# Patient Record
Sex: Female | Born: 1975
Health system: Southern US, Community
[De-identification: ages and names within clinical notes are randomized; demographics above are authoritative.]

## PROBLEM LIST (undated history)

## (undated) DIAGNOSIS — K219 Gastro-esophageal reflux disease without esophagitis: Secondary | ICD-10-CM

## (undated) DIAGNOSIS — K5792 Diverticulitis of intestine, part unspecified, without perforation or abscess without bleeding: Secondary | ICD-10-CM

## (undated) DIAGNOSIS — E669 Obesity, unspecified: Secondary | ICD-10-CM

## (undated) DIAGNOSIS — R011 Cardiac murmur, unspecified: Secondary | ICD-10-CM

## (undated) HISTORY — PX: TONSILLECTOMY: SUR1361

## (undated) HISTORY — DX: Obesity, unspecified: E66.9

## (undated) HISTORY — DX: Diverticulitis of intestine, part unspecified, without perforation or abscess without bleeding: K57.92

## (undated) HISTORY — DX: Cardiac murmur, unspecified: R01.1

## (undated) HISTORY — DX: Gastro-esophageal reflux disease without esophagitis: K21.9

---

## 2008-12-09 ENCOUNTER — Emergency Department (HOSPITAL_COMMUNITY): Admission: EM | Admit: 2008-12-09 | Discharge: 2008-12-09 | Payer: Self-pay | Admitting: Emergency Medicine

## 2017-05-09 ENCOUNTER — Ambulatory Visit: Payer: Self-pay | Admitting: Family Medicine

## 2017-05-10 ENCOUNTER — Ambulatory Visit: Payer: Self-pay | Admitting: Family Medicine

## 2017-11-18 ENCOUNTER — Encounter (HOSPITAL_COMMUNITY): Payer: Self-pay

## 2017-11-18 ENCOUNTER — Emergency Department (HOSPITAL_COMMUNITY)
Admission: EM | Admit: 2017-11-18 | Discharge: 2017-11-18 | Disposition: A | Discharge status: Home / Self Care | Payer: No Typology Code available for payment source | Attending: Emergency Medicine | Admitting: Emergency Medicine

## 2017-11-18 DIAGNOSIS — S0990XA Unspecified injury of head, initial encounter: Secondary | ICD-10-CM | POA: Diagnosis present

## 2017-11-18 DIAGNOSIS — Y9389 Activity, other specified: Secondary | ICD-10-CM | POA: Insufficient documentation

## 2017-11-18 DIAGNOSIS — S0083XA Contusion of other part of head, initial encounter: Secondary | ICD-10-CM | POA: Insufficient documentation

## 2017-11-18 DIAGNOSIS — Y9289 Other specified places as the place of occurrence of the external cause: Secondary | ICD-10-CM | POA: Diagnosis not present

## 2017-11-18 DIAGNOSIS — W2209XA Striking against other stationary object, initial encounter: Secondary | ICD-10-CM | POA: Insufficient documentation

## 2017-11-18 DIAGNOSIS — S060X0A Concussion without loss of consciousness, initial encounter: Secondary | ICD-10-CM | POA: Diagnosis not present

## 2017-11-18 DIAGNOSIS — Y990 Civilian activity done for income or pay: Secondary | ICD-10-CM | POA: Diagnosis not present

## 2017-11-18 MED ORDER — IBUPROFEN 800 MG PO TABS
800.0000 mg | ORAL_TABLET | Freq: Once | ORAL | Status: AC
  Administered 2017-11-18: 800 mg via ORAL
  Filled 2017-11-18: qty 1

## 2017-11-18 MED ORDER — IBUPROFEN 600 MG PO TABS: 600.0000 mg | ORAL_TABLET | Freq: Four times a day (QID) | ORAL | 0 refills | Status: DC | PRN

## 2017-11-18 NOTE — ED Triage Notes (Signed)
Pt presents for evaluation of head injury. States hand truck handle hit her in forehead while at work today. Denies falling or LOC. Pt has swelling to R forehead and some nausea. Pt AxO x4, ambulatory. No blood thinners.

## 2017-11-18 NOTE — ED Provider Notes (Signed)
[facility] EMERGENCY DEPARTMENT Provider Note   CSN: [id number] Arrival date & time: 11/18/17  1130     History   Chief Complaint Chief Complaint  Patient presents with  . Head Injury    HPI Samantha Delgado is a 41 y.o. female.  HPI   41 year old female presenting for evaluation of a forehead injury.  Patient was at work today when the hand truck handle struck her in the forehead as she was putting down a box of fries.  She report sharp pain to her forehead intense initially but has since improved.  Pain is currently 2 out of 10 better with ice pack.  Her workplace does encourage patient to come to the ER for further evaluation.  She did report having some mild dizziness, feeling fatigued, and mildly nauseous without vomiting.  No vision changes, no neck pain no focal numbness or weakness.  Incident happened approximately 3 hours ago.  She is not on any blood thinner medication.  She denies any loss of consciousness.  History reviewed. No pertinent past medical history.  There are no active problems to display for this patient.   History reviewed. No pertinent surgical history.  OB History    No data available       Home Medications    Prior to Admission medications   Not on File    Family History No family history on file.  Social History Social History   Tobacco Use  . Smoking status: Not on file  Substance Use Topics  . Alcohol use: Not on file  . Drug use: Not on file     Allergies   Patient has no known allergies.   Review of Systems Review of Systems  All other systems reviewed and are negative.    Physical Exam Updated Vital Signs BP (!) 134/95 (BP Location: Right Arm)   Pulse (!) 59   Temp 98.4 F (36.9 C) (Oral)   Resp 14   LMP 10/29/2017 (Approximate)   SpO2 100%   Physical Exam  Constitutional: She is oriented to person, place, and time. She appears well-developed and well-nourished. No distress.  HENT:    Head: Atraumatic.  3 x 4 cm hematoma noted to anterior forehead tender to palpation without any crepitus.  No hemotympanum, no septal hematoma, no malocclusion, no midface tenderness, no raccoon's eyes or battle signs  Eyes: Conjunctivae are normal.  Neck: Neck supple.  Neurological: She is alert and oriented to person, place, and time. She has normal strength. No cranial nerve deficit or sensory deficit. Gait normal. GCS eye subscore is 4. GCS verbal subscore is 5. GCS motor subscore is 6.  Skin: No rash noted.  Psychiatric: She has a normal mood and affect.  Nursing note and vitals reviewed.    ED Treatments / Results  Labs (all labs ordered are listed, but only abnormal results are displayed) Labs Reviewed - No data to display  EKG  EKG Interpretation None       Radiology No results found.  Procedures Procedures (including critical care time)  Medications Ordered in ED Medications - No data to display   Initial Impression / Assessment and Plan / ED Course  I have reviewed the triage vital signs and the nursing notes.  Pertinent labs & imaging results that were available during my care of the patient were reviewed by me and considered in my medical decision making (see chart for details).     BP (!) 134/95 (BP Location: Right  Arm)   Pulse (!) 59   Temp 98.4 F (36.9 C) (Oral)   Resp 14   LMP 10/29/2017 (Approximate)   SpO2 100%    Final Clinical Impressions(s) / ED Diagnoses   Final diagnoses:  Forehead contusion, initial encounter  Concussion without loss of consciousness, initial encounter    ED Discharge Orders        Ordered    ibuprofen (ADVIL,MOTRIN) 600 MG tablet  Every 6 hours PRN     11/18/17 1311     1:10 PM Patient here for minor head injury.  She has an ecchymosis to her forehead from the impact of the handle of the hand truck striking it.  She is mentating appropriately.  No focal neuro deficit.  She exhibits symptoms of a mild  concussion.  I have low suspicion for skull fracture or intracranial injury.  Discussed risks and benefits of head CT scan.  Patient myself agrees advanced imaging not indicated at this time.  Rice therapy discussed.  Return precautions discussed.   Domenic Moras, PA-C 11/18/17 1312    Charlesetta Shanks, MD 11/19/17 [phone]

## 2017-11-18 NOTE — ED Notes (Signed)
Declined W/C at D/C and was escorted to lobby by RN. 

## 2019-01-31 ENCOUNTER — Ambulatory Visit (INDEPENDENT_AMBULATORY_CARE_PROVIDER_SITE_OTHER): Payer: No Typology Code available for payment source | Admitting: Family Medicine

## 2019-01-31 ENCOUNTER — Encounter: Payer: Self-pay | Admitting: Family Medicine

## 2019-01-31 ENCOUNTER — Other Ambulatory Visit: Payer: Self-pay

## 2019-01-31 VITALS — BP 118/78 | HR 68 | Temp 97.6°F | Ht 60.0 in | Wt 204.2 lb

## 2019-01-31 DIAGNOSIS — Z0000 Encounter for general adult medical examination without abnormal findings: Secondary | ICD-10-CM

## 2019-01-31 DIAGNOSIS — Z30432 Encounter for removal of intrauterine contraceptive device: Secondary | ICD-10-CM

## 2019-01-31 NOTE — Patient Instructions (Signed)
-  you are fertile! Make sure and use condoms. Follow up with GYN.  -mmg information given - I future ordered your labs so just make appointment after you have been fasting.   Look good! Work on [employer] loss/exercise.

## 2019-01-31 NOTE — Progress Notes (Signed)
Patient: Samantha Delgado MRN: [id number] DOB: 11-21-1976 PCP: Orma Flaming, MD     Subjective:  Chief Complaint  Patient presents with  . Establish Care    HPI: The patient is a 43 y.o. female who presents today for annual exam. She denies any changes to past medical history. There have been no recent hospitalizations. They are not following a well balanced diet and exercise plan. Weight has been stable/slowly increasing. No complaints today.   Does have IUD in place x 11 years.   NO FH of breast cancer, but + FH of bladder cancer in her father. He is a heavy smoker.    There is no immunization history on file for this patient.  Colonoscopy: at age 89  Mammogram: not had this done.  Pap smear: has not had one in a very long time. Likely when she had her IUD put in.  Tdap: 8 years ago.  Hiv: pregnancy    Review of Systems  Constitutional: Negative for chills, fatigue and fever.  HENT: Negative for dental problem, ear pain, hearing loss and trouble swallowing.   Eyes: Negative for visual disturbance.  Respiratory: Negative for cough, chest tightness and shortness of breath.   Cardiovascular: Negative for chest pain, palpitations and leg swelling.  Gastrointestinal: Negative for abdominal pain, blood in stool, diarrhea and nausea.  Endocrine: Negative.  Negative for cold intolerance, polydipsia, polyphagia and polyuria.  Genitourinary: Negative for dysuria and hematuria.  Musculoskeletal: Positive for neck stiffness. Negative for arthralgias and back pain.  Skin: Negative.  Negative for rash.  Neurological: Positive for headaches. Negative for dizziness.  Psychiatric/Behavioral: Negative for dysphoric mood and sleep disturbance. The patient is not nervous/anxious.     Allergies Patient has No Known Allergies.  Past Medical History Patient  has no past medical history on file.  Surgical History Patient  has no past surgical history on file.  Family  History Pateint's family history is not on file.  Social History Patient  reports that she has never smoked. She has never used smokeless tobacco. She reports previous alcohol use.    Objective: Vitals:   01/31/19 1330  BP: 118/78  Pulse: 68  Temp: 97.6 F (36.4 C)  TempSrc: Oral  SpO2: 98%  Weight: 204 lb 3.2 oz (92.6 kg)  Height: 5' (1.524 m)    Body mass index is 39.88 kg/m.  Physical Exam Vitals signs reviewed. Exam conducted with a chaperone present.  Constitutional:      Appearance: She is obese.  HENT:     Right Ear: Tympanic membrane, ear canal and external ear normal.     Left Ear: Tympanic membrane, ear canal and external ear normal.     Nose: Nose normal.  Neck:     Musculoskeletal: Normal range of motion and neck supple.  Cardiovascular:     Rate and Rhythm: Normal rate and regular rhythm.     Heart sounds: Normal heart sounds.  Pulmonary:     Effort: Pulmonary effort is normal.     Breath sounds: Normal breath sounds.  Abdominal:     General: Abdomen is flat. Bowel sounds are normal.     Palpations: Abdomen is soft.  Genitourinary:    General: Normal vulva.     Vagina: Normal.     Cervix: Cervical bleeding present.  Skin:    Capillary Refill: Capillary refill takes less than 2 seconds.  Neurological:     General: No focal deficit present.     Mental Status: She is alert  and oriented to person, place, and time. Mental status is at baseline.  Psychiatric:        Mood and Affect: Mood normal.        Behavior: Behavior normal.    IUD removal: verbal consent obtained. Chaperone present. Speculum inserted with ease and cervix easily found. Blood from os as patient is on period. Strings visualized. skyler forceps inserted, strings grabbed and IUD easily removed. No complications and patient tolerated well.     Assessment/plan: 1. Annual physical exam Routine lab work will be done. She will come in when she is fasting. mmg handout given. Needs pap smear  and will do at gyn consult. Has had hiv/tdap. Discussed diet/exercise and weight loss. F/u in one year or as needed.  - CBC with Differential/Platelet; Future - Comprehensive metabolic panel; Future - Lipid panel; Future - TSH; Future  2. Encounter for IUD removal Tolerated well. Discussed she is fertile and to use condoms. Will schedule with gyn. Deferred pap today since on period and can have done with gyn. Handout for mammogram given.    Return in about 1 year (around 01/31/2020).     Orma Flaming, MD [place]  01/31/2019

## 2019-09-20 ENCOUNTER — Other Ambulatory Visit: Payer: Self-pay

## 2019-09-20 ENCOUNTER — Ambulatory Visit: Payer: No Typology Code available for payment source

## 2019-09-20 DIAGNOSIS — Z20822 Contact with and (suspected) exposure to covid-19: Secondary | ICD-10-CM

## 2019-09-21 LAB — NOVEL CORONAVIRUS, NAA: SARS-CoV-2, NAA: NOT DETECTED

## 2019-10-29 ENCOUNTER — Other Ambulatory Visit: Payer: Self-pay

## 2019-10-29 DIAGNOSIS — Z20822 Contact with and (suspected) exposure to covid-19: Secondary | ICD-10-CM

## 2019-10-31 LAB — NOVEL CORONAVIRUS, NAA: SARS-CoV-2, NAA: NOT DETECTED

## 2020-04-30 ENCOUNTER — Ambulatory Visit: Payer: No Typology Code available for payment source | Admitting: Family Medicine

## 2020-12-14 ENCOUNTER — Ambulatory Visit (INDEPENDENT_AMBULATORY_CARE_PROVIDER_SITE_OTHER): Payer: BC Managed Care – PPO | Admitting: Podiatry

## 2020-12-14 ENCOUNTER — Other Ambulatory Visit: Payer: Self-pay

## 2020-12-14 ENCOUNTER — Ambulatory Visit (INDEPENDENT_AMBULATORY_CARE_PROVIDER_SITE_OTHER): Payer: BC Managed Care – PPO

## 2020-12-14 DIAGNOSIS — B353 Tinea pedis: Secondary | ICD-10-CM

## 2020-12-14 DIAGNOSIS — L989 Disorder of the skin and subcutaneous tissue, unspecified: Secondary | ICD-10-CM | POA: Diagnosis not present

## 2020-12-14 DIAGNOSIS — M722 Plantar fascial fibromatosis: Secondary | ICD-10-CM | POA: Diagnosis not present

## 2020-12-14 MED ORDER — CLOTRIMAZOLE-BETAMETHASONE 1-0.05 % EX CREA: 1.0000 "application " | TOPICAL_CREAM | Freq: Two times a day (BID) | CUTANEOUS | 3 refills | Status: DC

## 2020-12-14 MED ORDER — TERBINAFINE HCL 250 MG PO TABS: 250.0000 mg | ORAL_TABLET | Freq: Every day | ORAL | 1 refills | Status: DC

## 2020-12-14 NOTE — Progress Notes (Signed)
   HPI: 45 y.o. female presenting today as a new patient for evaluation of dry flaking skin to the bilateral feet.  Patient states that her heels are always dry and cracking with thick skin.  She also gets symptomatic calluses to the forefoot that are very painful.  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  Patient relates significant pain throughout the day during her shift.  She denies a history of injury.  No past medical history on file.   Physical Exam: General: The patient is alert and oriented x3 in no acute distress.  Dermatology: Skin is warm, dry and supple bilateral lower extremities.  Diffuse hyperkeratotic skin noted throughout the weightbearing surface of the bilateral feet.  There is a central nucleated core to the subfifth MTPJ bilateral.  Findings consistent with a porokeratosis  Vascular: Palpable pedal pulses bilaterally. No edema or erythema noted. Capillary refill within normal limits.  Neurological: Epicritic and protective threshold grossly intact bilaterally.   Musculoskeletal Exam: Range of motion within normal limits to all pedal and ankle joints bilateral. Muscle strength 5/5 in all groups bilateral.   Radiographic Exam:  Normal osseous mineralization. Joint spaces preserved. No fracture/dislocation/boney destruction.    Assessment: 1.  Possible tinea pedis bilateral 2.  Porokeratosis subfifth MTPJ bilateral   Plan of Care:  1. Patient evaluated. X-Rays reviewed.  2.  Excisional debridement of the hyperkeratotic callus tissue was performed using a chisel blade without incident or bleeding 3.  Prescription for Lotrisone cream apply 2 times daily 4.  Prescription for Lamisil 250 mg daily #30. 5.  Explained to the patient that first we need to eradicate or eliminate any fungal element/tinea pedis to the bilateral feet.  Then we can talk about routine maintenance of the foot care 6.  Continue wearing good supportive  shoes 7.  Return to clinic in 6 weeks  *Manager at [employer], DPM [facility]  Dr. Edrick Kins, DPM    [address], [place] [address]                Office [phone]  Fax [phone]

## 2020-12-29 DIAGNOSIS — D239 Other benign neoplasm of skin, unspecified: Secondary | ICD-10-CM | POA: Diagnosis not present

## 2020-12-29 DIAGNOSIS — Z01419 Encounter for gynecological examination (general) (routine) without abnormal findings: Secondary | ICD-10-CM | POA: Diagnosis not present

## 2020-12-29 DIAGNOSIS — Z6841 Body Mass Index (BMI) 40.0 and over, adult: Secondary | ICD-10-CM | POA: Diagnosis not present

## 2020-12-29 DIAGNOSIS — D649 Anemia, unspecified: Secondary | ICD-10-CM | POA: Diagnosis not present

## 2020-12-29 DIAGNOSIS — Z130 Encounter for screening for diseases of the blood and blood-forming organs and certain disorders involving the immune mechanism: Secondary | ICD-10-CM | POA: Diagnosis not present

## 2021-01-14 DIAGNOSIS — L918 Other hypertrophic disorders of the skin: Secondary | ICD-10-CM | POA: Diagnosis not present

## 2021-01-14 DIAGNOSIS — Z30430 Encounter for insertion of intrauterine contraceptive device: Secondary | ICD-10-CM | POA: Diagnosis not present

## 2021-01-14 DIAGNOSIS — Z3202 Encounter for pregnancy test, result negative: Secondary | ICD-10-CM | POA: Diagnosis not present

## 2021-01-14 DIAGNOSIS — D239 Other benign neoplasm of skin, unspecified: Secondary | ICD-10-CM | POA: Diagnosis not present

## 2021-01-14 DIAGNOSIS — Z1231 Encounter for screening mammogram for malignant neoplasm of breast: Secondary | ICD-10-CM | POA: Diagnosis not present

## 2021-01-25 ENCOUNTER — Other Ambulatory Visit: Payer: Self-pay

## 2021-01-25 ENCOUNTER — Ambulatory Visit (INDEPENDENT_AMBULATORY_CARE_PROVIDER_SITE_OTHER): Payer: BC Managed Care – PPO | Admitting: Podiatry

## 2021-01-25 DIAGNOSIS — L989 Disorder of the skin and subcutaneous tissue, unspecified: Secondary | ICD-10-CM

## 2021-01-25 DIAGNOSIS — B353 Tinea pedis: Secondary | ICD-10-CM | POA: Diagnosis not present

## 2021-01-25 NOTE — Progress Notes (Signed)
   HPI: 45 y.o. female presenting today for follow-up evaluation of hyperkeratosis and possible tinea pedis to the bilateral feet.  Patient states that she took the oral Lamisil as prescribed and is also been applying the topical Lotrisone cream.  She states that she has significant improvement of the past month.  She presents for further treatment evaluation  No past medical history on file.   Physical Exam: General: The patient is alert and oriented x3 in no acute distress.  Dermatology: Skin is warm, dry and supple bilateral lower extremities.  Diffuse hyperkeratotic skin noted throughout the weightbearing surface of the bilateral feet.  There is a central nucleated core to the subfifth MTPJ bilateral.  Findings consistent with a porokeratosis  Vascular: Palpable pedal pulses bilaterally. No edema or erythema noted. Capillary refill within normal limits.  Neurological: Epicritic and protective threshold grossly intact bilaterally.   Musculoskeletal Exam: Range of motion within normal limits to all pedal and ankle joints bilateral. Muscle strength 5/5 in all groups bilateral.    Assessment: 1.  Possible tinea pedis bilateral 2.  Porokeratosis subfifth MTPJ bilateral   Plan of Care:  1. Patient evaluated. X-Rays reviewed.  2.  Overall the patient has improved significantly.  Continue Lotrisone cream two times daily as needed Three.  Continue wearing good supportive shoes at work Four.  Return to clinic as needed  Recruitment consultant at [employer]      Edrick Kins, DPM [facility]  Dr. Edrick Kins, DPM    [address], [place] [address]                Office [phone]  Fax [phone]

## 2021-07-23 ENCOUNTER — Ambulatory Visit (INDEPENDENT_AMBULATORY_CARE_PROVIDER_SITE_OTHER): Payer: BC Managed Care – PPO | Admitting: Physician Assistant

## 2021-07-23 ENCOUNTER — Encounter: Payer: Self-pay | Admitting: Physician Assistant

## 2021-07-23 ENCOUNTER — Other Ambulatory Visit: Payer: Self-pay

## 2021-07-23 VITALS — BP 113/76 | HR 65 | Temp 98.3°F | Ht 60.0 in | Wt 214.2 lb

## 2021-07-23 DIAGNOSIS — Z23 Encounter for immunization: Secondary | ICD-10-CM | POA: Diagnosis not present

## 2021-07-23 DIAGNOSIS — Z1211 Encounter for screening for malignant neoplasm of colon: Secondary | ICD-10-CM

## 2021-07-23 DIAGNOSIS — Z6841 Body Mass Index (BMI) 40.0 and over, adult: Secondary | ICD-10-CM | POA: Diagnosis not present

## 2021-07-23 DIAGNOSIS — Z0000 Encounter for general adult medical examination without abnormal findings: Secondary | ICD-10-CM

## 2021-07-23 DIAGNOSIS — K219 Gastro-esophageal reflux disease without esophagitis: Secondary | ICD-10-CM

## 2021-07-23 MED ORDER — PANTOPRAZOLE SODIUM 20 MG PO TBEC: 20.0000 mg | DELAYED_RELEASE_TABLET | Freq: Every day | ORAL | 2 refills | Status: DC

## 2021-07-23 NOTE — Progress Notes (Signed)
Established Patient Office Visit  Subjective:  Patient ID: Samantha Delgado, female    DOB: [birth year]  Age: 45 y.o. MRN: [id number]  CC:  Chief Complaint  Patient presents with   Transitions Of Care   Annual Exam    HPI Samantha Delgado presents for [facility] from Dr. Rogers Delgado and annual CPE.  Acute concerns: GERD - several years almost daily. Takes TUMS all day it seems.  Weight gain - She can get free [employer] with letter stating medical necessity. 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  She is ready to make active changes to improve her health.  She is very concerned about long-term effects of her weight.  Health maintenance: Lifestyle/ exercise: Minimal Nutrition: Needs to improve Mental health: Doing well Sleep: No concerns, usually 6-8 hours Substance use: None  Immunizations: Tdap due  Colonoscopy: Due next year Pap: [facility]. Dr. Gertie Delgado - Feb 8th; normal. IUD placed Feb 24th, 2022.  Mammogram: Done this year and was normal.    History reviewed. No pertinent past medical history.  History reviewed. No pertinent surgical history.  History reviewed. No pertinent family history.  Social History   Socioeconomic History   Marital status: Single    Spouse name: Not on file   Number of children: Not on file   Years of education: Not on file   Highest education level: Not on file  Occupational History   Not on file  Tobacco Use   Smoking status: Never   Smokeless tobacco: Never  Vaping Use   Vaping Use: Never used  Substance and Sexual Activity   Alcohol use: Not Currently   Drug use: Not on file   Sexual activity: Not on file  Other Topics Concern   Not on file  Social History Narrative   Not on file   Social Determinants of Health   Financial Resource Strain: Not on file  Food Insecurity: Not on file  Transportation Needs: Not on file  Physical Activity: Not on file  Stress: Not on file   Social Connections: Not on file  Intimate Partner Violence: Not on file    Outpatient Medications Prior to Visit  Medication Sig Dispense Refill   ibuprofen (ADVIL,MOTRIN) 600 MG tablet Take 1 tablet (600 mg total) by mouth every 6 (six) hours as needed. 30 tablet 0   clotrimazole-betamethasone (LOTRISONE) cream Apply 1 application topically 2 (two) times daily. 45 g 3   terbinafine (LAMISIL) 250 MG tablet Take 1 tablet (250 mg total) by mouth daily. 30 tablet 1   No facility-administered medications prior to visit.    No Known Allergies  ROS Review of Systems  Constitutional:  Positive for unexpected weight change. Negative for activity change, appetite change and fever.  HENT:  Negative for congestion.   Eyes:  Negative for visual disturbance.  Respiratory:  Negative for apnea, cough and shortness of breath.   Cardiovascular:  Negative for chest pain, palpitations and leg swelling.  Gastrointestinal:  Negative for abdominal pain, blood in stool, constipation and diarrhea.       +for GERD  Endocrine: Negative for polydipsia, polyphagia and polyuria.  Genitourinary:  Negative for dysuria and pelvic pain.  Musculoskeletal:  Negative for arthralgias.  Skin:  Negative for rash.  Neurological:  Negative for dizziness, weakness and headaches.  Hematological:  Negative for adenopathy. Does not bruise/bleed easily.  Psychiatric/Behavioral:  Negative for sleep disturbance and suicidal ideas. The patient is  not nervous/anxious.      Objective:    Physical Exam Vitals and nursing note reviewed.  Constitutional:      General: She is not in acute distress.    Appearance: Normal appearance. She is normal weight.  HENT:     Head: Normocephalic.     Right Ear: Tympanic membrane, ear canal and external ear normal.     Left Ear: Tympanic membrane, ear canal and external ear normal.     Nose: Nose normal.     Mouth/Throat:     Mouth: Mucous membranes are moist.  Eyes:     Extraocular  Movements: Extraocular movements intact.     Conjunctiva/sclera: Conjunctivae normal.     Pupils: Pupils are equal, round, and reactive to light.  Cardiovascular:     Rate and Rhythm: Normal rate and regular rhythm.     Pulses: Normal pulses.     Heart sounds: No murmur heard.    Comments: Small varicose veins present on both lower extremities Pulmonary:     Effort: Pulmonary effort is normal.     Breath sounds: Normal breath sounds.  Abdominal:     General: Abdomen is flat. Bowel sounds are normal.     Palpations: Abdomen is soft.     Tenderness: There is no abdominal tenderness.  Musculoskeletal:        General: Normal range of motion.     Cervical back: Normal range of motion.     Right lower leg: 2+ Edema present.     Left lower leg: 2+ Edema present.  Skin:    General: Skin is warm.  Neurological:     General: No focal deficit present.     Mental Status: She is alert and oriented to person, place, and time.     Gait: Gait normal.  Psychiatric:        Mood and Affect: Mood normal.        Behavior: Behavior normal.    BP 113/76   Pulse 65   Temp 98.3 F (36.8 C)   Ht 5' (1.524 m)   Wt 214 lb 4 oz (97.2 kg)   LMP 07/07/2021 (Approximate)   SpO2 98%   BMI 41.84 kg/m  Wt Readings from Last 3 Encounters:  07/23/21 214 lb 4 oz (97.2 kg)  01/31/19 204 lb 3.2 oz (92.6 kg)     Health Maintenance Due  Topic Date Due   COVID-19 Vaccine (1) Never done   HIV Screening  Never done   Hepatitis C Screening  Never done   PAP SMEAR-Modifier  Never done    There are no preventive care reminders to display for this patient.  No results found for: TSH No results found for: WBC, HGB, HCT, MCV, PLT No results found for: NA, K, CHLORIDE, CO2, GLUCOSE, BUN, CREATININE, BILITOT, ALKPHOS, AST, ALT, PROT, ALBUMIN, CALCIUM, ANIONGAP, EGFR, GFR No results found for: CHOL No results found for: HDL No results found for: LDLCALC No results found for: TRIG No results found for:  CHOLHDL No results found for: HGBA1C    Assessment & Plan:   Problem List Items Addressed This Visit   None Visit Diagnoses     Encounter for annual physical exam    -  Primary   Relevant Orders   Tdap vaccine greater than or equal to 7yo IM (Completed)   Morbid obesity ([place])       Relevant Orders   Amb Ref to Medical Weight Management   BMI 40.0-44.9, adult (  [place])       Relevant Orders   Amb Ref to Medical Weight Management   Screening for colon cancer       Relevant Orders   Ambulatory referral to Gastroenterology   GERD without esophagitis       Relevant Medications   pantoprazole (PROTONIX) 20 MG tablet   Need for tetanus booster       Relevant Orders   Tdap vaccine greater than or equal to 7yo IM (Completed)   Need for influenza vaccination       Relevant Orders   Flu Vaccine QUAD 6+ mos PF IM (Fluarix Quad PF) (Completed)       Meds ordered this encounter  Medications   pantoprazole (PROTONIX) 20 MG tablet    Sig: Take 1 tablet (20 mg total) by mouth daily.    Dispense:  30 tablet    Refill:  2   PLAN Age-appropriate screening and counseling performed today.  She has labs present with her today, see scanned in report.  Preventive measures discussed and printed in AVS for patient.  She will have regular dental and vision exams.  Her tetanus shot was updated in office today.  It is advised to get a flu shot in the fall.  She is ready to make changes about her weight and a referral was placed to healthy weight and wellness.  I will also start her on Protonix 20 mg daily for acid reflux.  Recheck with me in about 6 months to check her progress.  Also referral placed for colon screening to start at age 89.   Follow-up: Return in about 6 months (around 01/20/2022) for GERD and weight recheck.    Eliyohu Class M Chaka Jefferys, PA-C

## 2021-07-23 NOTE — Patient Instructions (Addendum)
Good to meet you today. Thanks for bringing labs with you. Referral for colonoscopy to start at age 45. Referral to Healthy Weight [facility] Wellness. Tdap today Start on Protonix for acid reflux.  Call sooner if any concerns.

## 2021-08-13 ENCOUNTER — Encounter (HOSPITAL_COMMUNITY): Payer: Self-pay | Admitting: Emergency Medicine

## 2021-08-13 ENCOUNTER — Other Ambulatory Visit: Payer: Self-pay

## 2021-08-13 ENCOUNTER — Ambulatory Visit (HOSPITAL_COMMUNITY)
Admission: EM | Admit: 2021-08-13 | Discharge: 2021-08-13 | Disposition: A | Discharge status: Home / Self Care | Payer: BC Managed Care – PPO | Attending: Physician Assistant | Admitting: Physician Assistant

## 2021-08-13 ENCOUNTER — Telehealth: Payer: Self-pay

## 2021-08-13 ENCOUNTER — Ambulatory Visit (INDEPENDENT_AMBULATORY_CARE_PROVIDER_SITE_OTHER): Payer: BC Managed Care – PPO

## 2021-08-13 DIAGNOSIS — R1013 Epigastric pain: Secondary | ICD-10-CM

## 2021-08-13 DIAGNOSIS — R110 Nausea: Secondary | ICD-10-CM | POA: Insufficient documentation

## 2021-08-13 DIAGNOSIS — R1032 Left lower quadrant pain: Secondary | ICD-10-CM | POA: Diagnosis not present

## 2021-08-13 DIAGNOSIS — R109 Unspecified abdominal pain: Secondary | ICD-10-CM | POA: Diagnosis not present

## 2021-08-13 LAB — COMPREHENSIVE METABOLIC PANEL
ALT: 24 U/L (ref 0–44)
AST: 22 U/L (ref 15–41)
Albumin: 3.5 g/dL (ref 3.5–5.0)
Alkaline Phosphatase: 31 U/L — ABNORMAL LOW (ref 38–126)
Anion gap: 8 (ref 5–15)
BUN: 7 mg/dL (ref 6–20)
CO2: 24 mmol/L (ref 22–32)
Calcium: 9 mg/dL (ref 8.9–10.3)
Chloride: 102 mmol/L (ref 98–111)
Creatinine, Ser: 0.8 mg/dL (ref 0.44–1.00)
GFR, Estimated: >60 mL/min (ref >60)
Glucose, Bld: 96 mg/dL (ref 70–99)
Potassium: 3.5 mmol/L (ref 3.5–5.1)
Sodium: 134 mmol/L — ABNORMAL LOW (ref 135–145)
Total Bilirubin: 1.1 mg/dL (ref 0.3–1.2)
Total Protein: 7.5 g/dL (ref 6.5–8.1)

## 2021-08-13 LAB — CBC WITH DIFFERENTIAL/PLATELET
Abs Immature Granulocytes: 0.05 10*3/uL (ref 0.00–0.07)
Basophils Absolute: 0 10*3/uL (ref 0.0–0.1)
Basophils Relative: 0 %
Eosinophils Absolute: 0.1 10*3/uL (ref 0.0–0.5)
Eosinophils Relative: 1 %
HCT: 39.4 % (ref 36.0–46.0)
Hemoglobin: 12.8 g/dL (ref 12.0–15.0)
Immature Granulocytes: 0 %
Lymphocytes Relative: 16 %
Lymphs Abs: 2.4 10*3/uL (ref 0.7–4.0)
MCH: 27.4 pg (ref 26.0–34.0)
MCHC: 32.5 g/dL (ref 30.0–36.0)
MCV: 84.2 fL (ref 80.0–100.0)
Monocytes Absolute: 0.9 10*3/uL (ref 0.1–1.0)
Monocytes Relative: 6 %
Neutro Abs: 11.8 10*3/uL — ABNORMAL HIGH (ref 1.7–7.7)
Neutrophils Relative %: 77 %
Platelets: 325 10*3/uL (ref 150–400)
RBC: 4.68 MIL/uL (ref 3.87–5.11)
RDW: 14.6 % (ref 11.5–15.5)
WBC: 15.4 10*3/uL — ABNORMAL HIGH (ref 4.0–10.5)
nRBC: 0 % (ref 0.0–0.2)

## 2021-08-13 LAB — POCT URINALYSIS DIPSTICK, ED / UC
Glucose, UA: NEGATIVE mg/dL
Ketones, ur: 15 mg/dL — AB
Leukocytes,Ua: NEGATIVE
Nitrite: NEGATIVE
Protein, ur: NEGATIVE mg/dL
Specific Gravity, Urine: >=1.03 (ref 1.005–1.030)
Urobilinogen, UA: 0.2 mg/dL (ref 0.0–1.0)
pH: 5.5 (ref 5.0–8.0)

## 2021-08-13 LAB — LIPASE, BLOOD: Lipase: 30 U/L (ref 11–51)

## 2021-08-13 LAB — POC URINE PREG, ED: Preg Test, Ur: NEGATIVE

## 2021-08-13 MED ORDER — DICYCLOMINE HCL 10 MG PO CAPS: 10.0000 mg | ORAL_CAPSULE | Freq: Three times a day (TID) | ORAL | 0 refills | Status: DC

## 2021-08-13 MED ORDER — SUCRALFATE 1 G PO TABS: 1.0000 g | ORAL_TABLET | Freq: Three times a day (TID) | ORAL | 0 refills | Status: DC

## 2021-08-13 NOTE — ED Provider Notes (Signed)
[place]    CSN: [id number] Arrival date & time: 08/13/21  1024      History   Chief Complaint Chief Complaint  Patient presents with   Abdominal Pain    HPI Samantha Delgado is a 45 y.o. female.   Patient presents today with a several day history of worsening abdominal pain and GI symptoms.  Reports that she initially had URI symptoms but these have since resolved and she is left with changes in bowel habits, abdominal pain, nausea.  Denies any emesis, body aches, fever.  She does report 1 episode of dark stool but states this occurred after she had taken Pepto-Bismol to help manage her symptoms.  This medication did not help with her pain.  She reports pain is intermittent without identifiable trigger and travels throughout her abdomen.  During episodes it is rated 7 on a 0-10 pain scale, described as cramping, no aggravating or alleviating factors identified.  She does have a history of GERD and has tried pantoprazole without improvement of symptoms.  She denies additional gastrointestinal disorder including ulcerative colitis or Crohn's disease.  Denies any medication changes, antibiotic use, recent travel.  Denies any previous abdominal surgery.   History reviewed. No pertinent past medical history.  There are no problems to display for this patient.   History reviewed. No pertinent surgical history.  OB History   No obstetric history on file.      Home Medications    Prior to Admission medications   Medication Sig Start Date End Date Taking? Authorizing Provider  dicyclomine (BENTYL) 10 MG capsule Take 1 capsule (10 mg total) by mouth 3 (three) times daily before meals. 08/13/21  Yes Herley Bernardini K, PA-C  sucralfate (CARAFATE) 1 g tablet Take 1 tablet (1 g total) by mouth in the morning, at noon, and at bedtime. 08/13/21  Yes Ladrea Holladay K, PA-C  ibuprofen (ADVIL,MOTRIN) 600 MG tablet Take 1 tablet (600 mg total) by mouth every 6 (six) hours as needed.  11/18/17   Domenic Moras, PA-C  pantoprazole (PROTONIX) 20 MG tablet Take 1 tablet (20 mg total) by mouth daily. 07/23/21 08/22/21  Allwardt, Randa Evens, PA-C    Family History History reviewed. No pertinent family history.  Social History Social History   Tobacco Use   Smoking status: Never   Smokeless tobacco: Never  Vaping Use   Vaping Use: Never used  Substance Use Topics   Alcohol use: Not Currently     Allergies   Patient has no known allergies.   Review of Systems Review of Systems  Constitutional:  Positive for activity change and fatigue. Negative for appetite change and fever.  HENT:  Negative for congestion (improved), sinus pressure, sneezing and sore throat.   Respiratory:  Negative for cough (improved) and shortness of breath.   Cardiovascular:  Negative for chest pain.  Gastrointestinal:  Positive for abdominal pain, nausea and vomiting. Negative for blood in stool, constipation and diarrhea.  Genitourinary:  Positive for urgency. Negative for dysuria and frequency.  Musculoskeletal:  Negative for arthralgias, back pain and myalgias.  Neurological:  Negative for dizziness, weakness, light-headedness, numbness and headaches.    Physical Exam Triage Vital Signs ED Triage Vitals  Enc Vitals Group     BP 08/13/21 1115 129/87     Pulse Rate 08/13/21 1115 89     Resp 08/13/21 1115 18     Temp 08/13/21 1115 98.7 F (37.1 C)     Temp Source 08/13/21 1115 Oral  SpO2 08/13/21 1115 99 %     Weight --      Height --      Head Circumference --      Peak Flow --      Pain Score 08/13/21 1112 7     Pain Loc --      Pain Edu? --      Excl. in [place]? --    No data found.  Updated Vital Signs BP 129/87 (BP Location: Right Arm)   Pulse 89   Temp 98.7 F (37.1 C) (Oral)   Resp 18   LMP 07/27/2021   SpO2 99%   Visual Acuity Right Eye Distance:   Left Eye Distance:   Bilateral Distance:    Right Eye Near:   Left Eye Near:    Bilateral Near:     Physical  Exam Vitals reviewed.  Constitutional:      General: She is awake. She is not in acute distress.    Appearance: Normal appearance. She is well-developed. She is not ill-appearing.     Comments: Very pleasant female appears stated age no acute distress sitting comfortably in exam room  HENT:     Head: Normocephalic and atraumatic.     Mouth/Throat:     Pharynx: Uvula midline. No oropharyngeal exudate or posterior oropharyngeal erythema.  Cardiovascular:     Rate and Rhythm: Normal rate and regular rhythm.     Heart sounds: Normal heart sounds, S1 normal and S2 normal. No murmur heard. Pulmonary:     Effort: Pulmonary effort is normal.     Breath sounds: Normal breath sounds. No wheezing, rhonchi or rales.     Comments: Clear to auscultation bilaterally Abdominal:     General: Bowel sounds are normal.     Palpations: Abdomen is soft.     Tenderness: There is abdominal tenderness in the epigastric area, left upper quadrant and left lower quadrant. There is no right CVA tenderness, left CVA tenderness, guarding or rebound. Negative signs include Murphy's sign.     Comments: Tenderness palpation in epigastrium and on left side of abdomen.  No evidence of acute abdomen on physical exam.  No CVA tenderness.  Musculoskeletal:     Cervical back: Normal range of motion and neck supple.  Psychiatric:        Behavior: Behavior is cooperative.     UC Treatments / Results  Labs (all labs ordered are listed, but only abnormal results are displayed) Labs Reviewed  POCT URINALYSIS DIPSTICK, ED / UC - Abnormal; Notable for the following components:      Result Value   Bilirubin Urine SMALL (*)    Ketones, ur 15 (*)    Hgb urine dipstick SMALL (*)    All other components within normal limits  CBC WITH DIFFERENTIAL/PLATELET  COMPREHENSIVE METABOLIC PANEL  LIPASE, BLOOD  POC URINE PREG, ED    EKG   Radiology DG Abdomen 1 View  Result Date: 08/13/2021 CLINICAL DATA:  Left lower quadrant  pain EXAM: ABDOMEN - 1 VIEW COMPARISON:  None. FINDINGS: The bowel gas pattern is normal. IUD noted in pelvis. No radio-opaque calculi or other significant radiographic abnormality are seen. IMPRESSION: Negative. Electronically Signed   By: Yetta Glassman M.D.   On: 08/13/2021 12:49    Procedures Procedures (including critical care time)  Medications Ordered in UC Medications - No data to display  Initial Impression / Assessment and Plan / UC Course  I have reviewed the triage vital signs and the  nursing notes.  Pertinent labs & imaging results that were available during my care of the patient were reviewed by me and considered in my medical decision making (see chart for details).      Discussed with patient that we do not have capabilities for imaging.  Her vital signs and physical exam are stable today but we discussed that if she has any worsening symptoms she would need to go to the emergency room for CT scan.  UA showed that she has not been eating but cannot show any evidence of infection.  Urine pregnancy was negative.  KUB obtained showed no evidence of obstruction.  CBC, CMP, lipase are pending.  She was given prescription for dicyclomine and Carafate to help manage symptoms.  Recommended she rest and drink plenty of fluid.  Discussed potential utility of following up with a GI specialist and reports that she already has an appointment scheduled due to acid reflux and will try to see if this can be moved forward.  We discussed at length alarm symptoms that warrant going to the emergency room to which she expressed understanding.  Strict return precautions given to which she expressed understanding.  All questions answered to patient satisfaction.  Final Clinical Impressions(s) / UC Diagnoses   Final diagnoses:  Left sided abdominal pain  Abdominal pain, epigastric  Nausea     Discharge Instructions      Your x-ray was normal.  Your urine did not show any significant evidence  of infection.  We will contact you if your lab work is abnormal and we need to change our treatment plan.  I have called in Bentyl and carafate which will help with abdominal cramping.  Eat a bland diet and drink plenty of fluid.  We do not have imaging capabilities so if you have worsening abdominal pain or develop any additional symptoms including fever, nausea/vomiting, worsening pain you need to go to the emergency room as we discussed.     ED Prescriptions     Medication Sig Dispense Auth. Provider   dicyclomine (BENTYL) 10 MG capsule Take 1 capsule (10 mg total) by mouth 3 (three) times daily before meals. 30 capsule Nene Aranas K, PA-C   sucralfate (CARAFATE) 1 g tablet Take 1 tablet (1 g total) by mouth in the morning, at noon, and at bedtime. 30 tablet Wendi Lastra, Derry Skill, PA-C      PDMP not reviewed this encounter.   Terrilee Croak, PA-C 08/13/21 1308

## 2021-08-13 NOTE — Discharge Instructions (Addendum)
Your x-ray was normal.  Your urine did not show any significant evidence of infection.  We will contact you if your lab work is abnormal and we need to change our treatment plan.  I have called in Bentyl and carafate which will help with abdominal cramping.  Eat a bland diet and drink plenty of fluid.  We do not have imaging capabilities so if you have worsening abdominal pain or develop any additional symptoms including fever, nausea/vomiting, worsening pain you need to go to the emergency room as we discussed.

## 2021-08-13 NOTE — Telephone Encounter (Signed)
Nurse Assessment Nurse: Doyle Askew, RN, Beth Date/Time Eilene Ghazi Time): 08/13/2021 9:17:13 AM Confirm and document reason for call. If symptomatic, describe symptoms. ---Caller states, having abdominal pain and bloating X 3 days. Caller states it is painful to urinate or to have BM, Pepto Bismol not working. Does the patient have any new or worsening symptoms? ---Yes Will a triage be completed? ---Yes Related visit to physician within the last 2 weeks? ---N/A Does the PT have any chronic conditions? (i.e. diabetes, asthma, this includes High risk factors for pregnancy, etc.) ---No Is the patient pregnant or possibly pregnant? (Ask all females between the ages of 62-55) ---No Is this a behavioral health or substance abuse call? ---No Guidelines Guideline Title Affirmed Question Affirmed Notes Nurse Date/Time (Eastern Time) Abdominal Pain - Female [1] MODERATE pain (e.g., interferes with normal activities) AND [2] pain comes and goes (cramps) AND [3] Doyle Askew, RN, Beth 08/13/2021 9:19:39 AM PLEASE NOTE: All timestamps contained within this report are represented as [place] Standard Time. CONFIDENTIALTY NOTICE: This fax transmission is intended only for the addressee. It contains information that is legally privileged, confidential or otherwise protected from use or disclosure. If you are not the intended recipient, you are strictly prohibited from reviewing, disclosing, copying using or disseminating any of this information or taking any action in reliance on or regarding this information. If you have received this fax in error, please notify us immediately by telephone so that we can arrange for its return to [place]. Phone: [phone], Toll-Free: [phone], Fax: [phone] Page: 2 of 2 Call Id: [id number] Guidelines Guideline Title Affirmed Question Affirmed Notes Nurse Date/Time Eilene Ghazi Time) present > 24 hours (Exception: pain with Vomiting or Diarrhea - see that Guideline) Disp.  Time Eilene Ghazi Time) Disposition Final User 08/13/2021 9:05:21 AM Attempt made - message left Sunday Corn, Eustaquio Maize 08/13/2021 9:23:58 AM See PCP within 24 Hours Yes Doyle Askew, RN, Eustaquio Maize Caller Disagree/Comply Comply Caller Understands Yes PreDisposition Call Doctor Care Advice Given Per Guideline SEE PCP WITHIN 24 HOURS: * IF OFFICE WILL BE OPEN: You need to be examined within the next 24 hours. Call your doctor (or NP/PA) when the office opens and make an appointment. * IF OFFICE WILL BE CLOSED: You need to be seen within the next 24 hours. A clinic or an urgent care center is often a good source of care if your doctor's office is closed or you can't get an appointment. OTC MEDS - BISMUTH SUBSALICYLATE (E.G., KAOPECTATE, PEPTO-BISMOL): * Helps reduce abdominal cramping, diarrhea, and vomiting. * Do not use for more than 2 days. CALL BACK IF: * Severe pain lasts over 1 hour * Constant pain lasts over 2 hours * You become worse CARE ADVICE given per Abdominal Pain, Female (Adult) guideline. Comments User: Melene Muller, RN Date/Time Eilene Ghazi Time): 08/13/2021 9:20:24 AM 6-7/10 Referrals GO TO FACILITY OTHER - SPECIF

## 2021-08-13 NOTE — Telephone Encounter (Signed)
FYI

## 2021-08-13 NOTE — ED Triage Notes (Signed)
Pt reports started feeling bad Tuesday. Reports abd pains started feeling bloated and cramping. Had some nausea. Took some Pepto and last night had very dark stool. Pressure in abd is worse with urination and moving bowels.  Took covid test at home that was negative.

## 2021-08-16 ENCOUNTER — Ambulatory Visit (INDEPENDENT_AMBULATORY_CARE_PROVIDER_SITE_OTHER): Payer: BC Managed Care – PPO | Admitting: Physician Assistant

## 2021-08-16 ENCOUNTER — Encounter: Payer: Self-pay | Admitting: Physician Assistant

## 2021-08-16 ENCOUNTER — Other Ambulatory Visit: Payer: Self-pay

## 2021-08-16 ENCOUNTER — Encounter: Payer: Self-pay | Admitting: Gastroenterology

## 2021-08-16 VITALS — BP 129/82 | HR 68 | Temp 98.0°F | Ht 60.0 in | Wt 212.2 lb

## 2021-08-16 DIAGNOSIS — D72829 Elevated white blood cell count, unspecified: Secondary | ICD-10-CM

## 2021-08-16 DIAGNOSIS — R1032 Left lower quadrant pain: Secondary | ICD-10-CM

## 2021-08-16 NOTE — Patient Instructions (Signed)
Please go to the lab for WBC recheck today. CT abd/ pelvis with contrast has been ordered. I suspect diverticulitis. Continue fluids / bland diet. Low threshold for ED if pain worsens or there is any acute change in symptoms.

## 2021-08-16 NOTE — Progress Notes (Signed)
Subjective:    Patient ID: Samantha Delgado, female    DOB: 1976-09-08, 45 y.o.   MRN: [id number]  Chief Complaint  Patient presents with   Abdominal Pain    Started last Tuesday left side    Abdominal Pain  Patient is in today for follow-up from [facility] health urgent care [facility] visit on 08/13/2021.  She presented that day with left-sided abdominal pain, epigastric abdominal pain, nausea.  Abdominal XR was negative.  She was advised to follow-up with GI and she was also given dicyclomine and Carafate to help manage her symptoms. She was told to present to ED if any acute worsening symptoms.   Started with congestion / flu-like symptoms. Then progressed to abdominal pain and horrible bloating. Still feeling a lot of pressure near the suprapubic abdomen, LLQ, and low back pain. She has been drinking soup broth and eating rice. She rested this weekend. Feeling a little bit better. Nausea has improved. No vomiting. No diarrhea. No fever or chills.   History reviewed. No pertinent past medical history.  History reviewed. No pertinent surgical history.  History reviewed. No pertinent family history.  Social History   Tobacco Use   Smoking status: Never   Smokeless tobacco: Never  Vaping Use   Vaping Use: Never used  Substance Use Topics   Alcohol use: Not Currently     No Known Allergies  Review of Systems  Gastrointestinal:  Positive for abdominal pain.  REFER TO HPI FOR PERTINENT POSITIVES AND NEGATIVES      Objective:     BP 129/82   Pulse 68   Temp 98 F (36.7 C)   Ht 5' (1.524 m)   Wt 212 lb 3.2 oz (96.3 kg)   LMP 07/27/2021   SpO2 98%   BMI 41.44 kg/m   Wt Readings from Last 3 Encounters:  08/16/21 212 lb 3.2 oz (96.3 kg)  07/23/21 214 lb 4 oz (97.2 kg)  01/31/19 204 lb 3.2 oz (92.6 kg)    BP Readings from Last 3 Encounters:  08/16/21 129/82  08/13/21 129/87  07/23/21 113/76     Physical Exam Vitals and nursing note reviewed.  Constitutional:       General: She is not in acute distress.    Appearance: Normal appearance. She is not ill-appearing.  HENT:     Head: Normocephalic and atraumatic.  Cardiovascular:     Rate and Rhythm: Normal rate and regular rhythm.     Pulses: Normal pulses.     Heart sounds: Normal heart sounds.  Pulmonary:     Effort: Pulmonary effort is normal.     Breath sounds: Normal breath sounds.  Abdominal:     General: Abdomen is flat. Bowel sounds are normal.     Palpations: Abdomen is soft. There is no mass.     Tenderness: There is abdominal tenderness (LLQ & suprapubic 5/10 TTP). There is no right CVA tenderness, left CVA tenderness, guarding or rebound.  Skin:    General: Skin is warm and dry.  Neurological:     General: No focal deficit present.     Mental Status: She is alert.  Psychiatric:        Mood and Affect: Mood normal.       Assessment & Plan:   Problem List Items Addressed This Visit   None Visit Diagnoses     Left lower quadrant abdominal pain    -  Primary   Relevant Orders   CBC with Differential/Platelet  CT Abdomen Pelvis W Contrast   Leukocytosis, unspecified type       Relevant Orders   CBC with Differential/Platelet       1. Left lower quadrant abdominal pain 2. Leukocytosis, unspecified type I personally reviewed urgent care report from 08-13-21, labs, and XR. WBC count was elevated at 15.4. Will repeat CBC today. CMP and Lipase were WNL. Her pain has slightly improved, however, her pain is still isolated to LLQ and suprapubic. ?possible diverticulitis. Not an acute abdomen today. Will schedule for CT abd/pelvis w/ contrast in the morning. Cont fluids, bland diet today. She knows low threshold for ED. Hold on antibiotics at this time per Up To Date practice guidelines.   This note was prepared with assistance of Systems analyst. Occasional wrong-word or sound-a-like substitutions may have occurred due to the inherent limitations of voice  recognition software.  Time Spent: 32 minutes of total time was spent on the date of the encounter performing the following actions: chart review prior to seeing the patient, obtaining history, performing a medically necessary exam, counseling on the treatment plan, placing orders, and documenting in our EHR.    Derika Eckles M Brealynn Contino, PA-C

## 2021-08-17 ENCOUNTER — Telehealth: Payer: Self-pay

## 2021-08-17 ENCOUNTER — Encounter: Payer: Self-pay | Admitting: Physician Assistant

## 2021-08-17 LAB — CBC WITH DIFFERENTIAL/PLATELET
Basophils Absolute: 0.1 10*3/uL (ref 0.0–0.1)
Basophils Relative: 0.8 % (ref 0.0–3.0)
Eosinophils Absolute: 0.2 10*3/uL (ref 0.0–0.7)
Eosinophils Relative: 1.5 % (ref 0.0–5.0)
HCT: 35 % — ABNORMAL LOW (ref 36.0–46.0)
Hemoglobin: 11.6 g/dL — ABNORMAL LOW (ref 12.0–15.0)
Lymphocytes Relative: 25.2 % (ref 12.0–46.0)
Lymphs Abs: 2.6 10*3/uL (ref 0.7–4.0)
MCHC: 33 g/dL (ref 30.0–36.0)
MCV: 82.1 fl (ref 78.0–100.0)
Monocytes Absolute: 0.7 10*3/uL (ref 0.1–1.0)
Monocytes Relative: 6.8 % (ref 3.0–12.0)
Neutro Abs: 6.8 10*3/uL (ref 1.4–7.7)
Neutrophils Relative %: 65.7 % (ref 43.0–77.0)
Platelets: 350 10*3/uL (ref 150.0–400.0)
RBC: 4.26 Mil/uL (ref 3.87–5.11)
RDW: 14.8 % (ref 11.5–15.5)
WBC: 10.4 10*3/uL (ref 4.0–10.5)

## 2021-08-17 NOTE — Telephone Encounter (Signed)
PT called to check on scheduling a CAT scan. Please Advise.

## 2021-08-17 NOTE — Addendum Note (Signed)
Addended by: Theresa Duty on: 08/17/2021 09:33 AM   Modules accepted: Orders

## 2021-08-18 ENCOUNTER — Ambulatory Visit (HOSPITAL_COMMUNITY): Payer: BC Managed Care – PPO

## 2021-08-24 ENCOUNTER — Ambulatory Visit (HOSPITAL_COMMUNITY): Payer: BC Managed Care – PPO

## 2021-08-25 ENCOUNTER — Encounter (HOSPITAL_COMMUNITY): Payer: Self-pay

## 2021-08-25 ENCOUNTER — Other Ambulatory Visit: Payer: Self-pay

## 2021-08-25 ENCOUNTER — Ambulatory Visit (HOSPITAL_COMMUNITY)
Admission: RE | Admit: 2021-08-25 | Discharge: 2021-08-25 | Disposition: A | Discharge status: Home / Self Care | Payer: BC Managed Care – PPO | Source: Ambulatory Visit | Attending: Physician Assistant | Admitting: Physician Assistant

## 2021-08-25 DIAGNOSIS — R1032 Left lower quadrant pain: Secondary | ICD-10-CM

## 2021-08-25 DIAGNOSIS — R109 Unspecified abdominal pain: Secondary | ICD-10-CM | POA: Diagnosis not present

## 2021-08-25 MED ORDER — IOHEXOL 350 MG/ML SOLN
75.0000 mL | Freq: Once | INTRAVENOUS | Status: AC | PRN
  Administered 2021-08-25: 75 mL via INTRAVENOUS

## 2021-08-26 ENCOUNTER — Telehealth: Payer: Self-pay

## 2021-08-26 NOTE — Telephone Encounter (Signed)
See Result Notes.  

## 2021-08-26 NOTE — Telephone Encounter (Signed)
Stacy from Radiology called to give STAT finding on a CT Scan. Marzetta Board would like a call back at [phone]. Please Advise.

## 2021-08-27 ENCOUNTER — Other Ambulatory Visit: Payer: Self-pay | Admitting: Physician Assistant

## 2021-08-27 ENCOUNTER — Encounter: Payer: Self-pay | Admitting: Physician Assistant

## 2021-08-27 DIAGNOSIS — K5720 Diverticulitis of large intestine with perforation and abscess without bleeding: Secondary | ICD-10-CM

## 2021-09-13 ENCOUNTER — Ambulatory Visit (INDEPENDENT_AMBULATORY_CARE_PROVIDER_SITE_OTHER): Payer: BC Managed Care – PPO | Admitting: Gastroenterology

## 2021-09-13 ENCOUNTER — Encounter: Payer: Self-pay | Admitting: Gastroenterology

## 2021-09-13 VITALS — BP 110/80 | HR 64 | Ht 59.45 in | Wt 207.2 lb

## 2021-09-13 DIAGNOSIS — R1032 Left lower quadrant pain: Secondary | ICD-10-CM

## 2021-09-13 DIAGNOSIS — K5732 Diverticulitis of large intestine without perforation or abscess without bleeding: Secondary | ICD-10-CM

## 2021-09-13 MED ORDER — METRONIDAZOLE 250 MG PO TABS: 250.0000 mg | ORAL_TABLET | Freq: Three times a day (TID) | ORAL | 0 refills | Status: DC

## 2021-09-13 MED ORDER — CIPROFLOXACIN HCL 500 MG PO TABS: 500.0000 mg | ORAL_TABLET | Freq: Two times a day (BID) | ORAL | 0 refills | Status: DC

## 2021-09-13 NOTE — Progress Notes (Signed)
Referring Provider: Allwardt, Randa Evens, PA-C Primary Care Physician:  Allwardt, Randa Evens, PA-C   Reason for Consultation: Diverticulitis  IMPRESSION:  Recent acute diverticulitis with microperforation    - treated supportively Persistent abdominal pain and altered bowel habits Patient questions about leaky gut based on the internet GERD controlled on PPI  Ongoing symptoms. Must consider persistent symptoms from diverticulitis, SCAD, IBD, or post-infectious IBS. Will plan antibiotics. Colonoscopy recommended. Discussed risks of colonoscopy after diverticulitis and increased risk for perforation after microperforation. She is establishing care with surgery prior to colonoscopy.    Reviewed recommendations to follow a high fiber diet or to use fiber supplements on a regular basis.  However, there is no need to avoid seeds, corn, berries, and nuts. Minimizing red meat in the diet may protect against future episodes of diverticulitis. Using NSAIDs may be associated with a moderately increased risk of occurrence of any episode of diverticulitis and complicated diverticulitis.  Discussed high risk for perforation with colonoscopy.   Briefly discussed leaky gut as drive by her questioning.      PLAN: - Start Cipro 500 mg BID and Flagyl 500 mg TID x 10 days - Colonoscopy at least 8 weeks after completing antibiotics for diverticulitis - Follow-up with surgery as planned - Low threshold for repeat CT scan if symptoms don't improve - See patient instructions for additional recommendations  HPI: Samantha Delgado is a 45 y.o. female referred by PA [place] for diverticulitis. She brings a spiral notebook with symptoms and questions to review during this appointment. She has being a lot of research online and is overwhelmed by the recommendations.   Recently found to have sigmoid diverticulitis on CT 08/26/2021 when she presented with left lower quadrant abdominal pain and leukocytosis. She was  not treated with antibiotics because her provider did not think she needed them after reading a recent medical article about diverticulitis. She was subsequently treated with dicyclomine, pantoprazole, and sucralfate. The pain is not as severe but she continues to have some LLQ discomfort and bloating. Her bowel habits are better as she has adjusted her diet to more plant-based but they have not returned to normal. She doesn't feel like everything is okay. No blood or mucous. No pain with defecation.   She has a longstanding history of reflux.  Uses Tums throughout the day.  Started pantoprazole 20 mg daily at the time of her last office visit.  Recent unintentional weight gain and concerned about the long-term effects of her weight.  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  No prior history of diverticulitis. Her primary doctor had referred her for colonoscopy in January 2022 for colon cancer screening but this had not yet been scheduled.   No known family history of gastrointestinal malignancy and a first-degree relative or chronic gastrointestinal disorders including ulcerative colitis and Crohn's. There is a family history of ovarian cancer (maternal grandmother at 7).  Father with bladder cancer.   I personally reviewed CT of the abdomen and pelvis with contrast from 08/25/2021 showing acute sigmoid diverticulitis with microperforation.  There is no abscess.  Past Medical History:  Diagnosis Date   Diverticulitis    CT scan   GERD (gastroesophageal reflux disease)    Heart murmur    Obesity     Past Surgical History:  Procedure Laterality Date   TONSILLECTOMY       Current Outpatient Medications  Medication Sig Dispense  Refill   ciprofloxacin (CIPRO) 500 MG tablet Take 1 tablet (500 mg total) by mouth 2 (two) times daily. 20 tablet 0   metroNIDAZOLE (FLAGYL) 250 MG tablet Take 1 tablet (250 mg total) by mouth 3 (three)  times daily. 30 tablet 0   pantoprazole (PROTONIX) 20 MG tablet Take 1 tablet (20 mg total) by mouth daily. (Patient not taking: Reported on 09/13/2021) 30 tablet 2   No current facility-administered medications for this visit.    Allergies as of 09/13/2021   (No Known Allergies)    Family History  Problem Relation Age of Onset   Clotting disorder Mother        blood clot after surgery   Bladder Cancer Father    Diabetes Father    Hypertension Father    Hyperlipidemia Father    Heart Problems Father    Diabetes Sister    Ovarian cancer Maternal Grandmother    Multiple myeloma Maternal Grandfather    Diabetes Paternal Grandmother    Heart failure Paternal Grandmother        Review of Systems: 12 system ROS is negative except as noted above.   Physical Exam: General:   Alert,  well-nourished, pleasant and cooperative in NAD Head:  Normocephalic and atraumatic. Eyes:  Sclera clear, no icterus.   Conjunctiva pink. Ears:  Normal auditory acuity. Nose:  No deformity, discharge,  or lesions. Mouth:  No deformity or lesions.   Neck:  Supple; no masses or thyromegaly. Lungs:  Clear throughout to auscultation.   No wheezes. Heart:  Regular rate and rhythm; no murmurs. Abdomen:  Soft, central obesity, mild LLQ tenderness, nondistended, normal bowel sounds, no rebound or guarding. No hepatosplenomegaly.   Rectal:  Deferred  Msk:  Symmetrical. No boney deformities LAD: No inguinal or umbilical LAD Extremities:  No clubbing or edema. Neurologic:  Alert and  oriented x4;  grossly nonfocal Skin:  Intact without significant lesions or rashes. Psych:  Alert and cooperative. Normal mood and affect.     Tynleigh Birt L. Tarri Glenn, MD, MPH 09/13/2021, 4:51 PM

## 2021-09-13 NOTE — Patient Instructions (Addendum)
It was pleasure to meet you today. I congratulate for all of the things you are doing to try to take good care of yourself.  Given your recent microperforation associated with diverticulitis and your ongoing symptoms, I recommend: - Complete 10 days of antibiotics with Cipro and Flagyl. - Follow-up with surgery as planned. - You should have a colonoscopy in 8-10 weeks after completing antibiotics - Work to achieve a healthy weight. - Follow a high fiber diet or use fiber supplements on a regular basis. We discussed Metamucil or Benefiber. - You may use probiotics but I don't have data to support their use. As there are few risky side effects, I think it's reasonable to try them and see if you feel different. Given the high cost, I wouldn't keep taking them if they aren't making you feel better.  - There is no need to avoid seeds, nuts, corn, and berries. - Non-steroidal antiinflammatory medications (such as ibuprofen, naproxen, etc) may increase your risk for a recurrent of diverticulitis. Please avoid these medications whenever possible.   - I like the UpToDate website for information about diverticulitis. It is medically sound and they aren't trying to see you anything.   Although there is a lot of information on the internet about leaky gut, the hype is ahead of the science. Leaky gut is different from diverticulitis.  It may be one of many mechanism underlying functional GI disease such as IBS, but, at this point we do not know what to do with it in clinical practice in terms of diagnosis and treatment. We don't even know it it's an epiphenomenon.  I recommend that we focus on using evidence based treatments to evaluate and treatment current symptoms.    It has been recommended to you by your physician that you have a(n) Colonoscopy completed. Per your request, we did not schedule the procedure(s) today. Please contact our office at [phone] should you decide to have the procedure completed.  You will be scheduled for a pre-visit and procedure at that time.   PRESCRIPTION MEDICATION(S):   We have sent the following medication(s) to your pharmacy:  Cipro 500 mg twice daily for ten days  Flagyl 500 mg three times daily for ten days  NOTE: If your medication(s) requires a PRIOR AUTHORIZATION, we will receive notification from your pharmacy. Once received, the process to submit for approval may take up to 7-10 business days. You will be contacted about any denials we have received from your insurance company as well as alternatives recommended by your provider.   It was my pleasure to provide care to you today. Based on our discussion, I am providing you with my recommendations below:  RECOMMENDATION(S):    FOLLOW UP: After your procedure, you will receive a call from my office staff regarding my recommendation for follow up.  BMI:  If you are age 40 or older, your body mass index should be between 23-30. Your Body mass index is 41.23 kg/m. If this is out of the aforementioned range listed, please consider follow up with your Primary Care Provider.  If you are age 72 or younger, your body mass index should be between 19-25. Your Body mass index is 41.23 kg/m. If this is out of the aformentioned range listed, please consider follow up with your Primary Care Provider.   MY CHART:  The [facility] providers would like to encourage you to use [facility] to communicate with providers for non-urgent requests or questions.  Due to long hold  times on the telephone, sending your provider a message by [facility] may be a faster and more efficient way to get a response.  Please allow 48 business hours for a response.  Please remember that this is for non-urgent requests.   Thank you for trusting me with your gastrointestinal care!    Thornton Park, MD, MPH

## 2021-10-18 ENCOUNTER — Encounter: Payer: Self-pay | Admitting: Gastroenterology

## 2021-11-29 DIAGNOSIS — Z8719 Personal history of other diseases of the digestive system: Secondary | ICD-10-CM | POA: Diagnosis not present

## 2021-11-29 DIAGNOSIS — K219 Gastro-esophageal reflux disease without esophagitis: Secondary | ICD-10-CM | POA: Diagnosis not present

## 2021-12-06 ENCOUNTER — Ambulatory Visit (AMBULATORY_SURGERY_CENTER): Payer: BC Managed Care – PPO | Admitting: *Deleted

## 2021-12-06 ENCOUNTER — Other Ambulatory Visit: Payer: Self-pay

## 2021-12-06 VITALS — Ht 60.0 in | Wt 205.0 lb

## 2021-12-06 DIAGNOSIS — R1032 Left lower quadrant pain: Secondary | ICD-10-CM

## 2021-12-06 DIAGNOSIS — K5732 Diverticulitis of large intestine without perforation or abscess without bleeding: Secondary | ICD-10-CM

## 2021-12-06 NOTE — Progress Notes (Signed)

## 2021-12-14 ENCOUNTER — Encounter: Payer: Self-pay | Admitting: Gastroenterology

## 2021-12-20 ENCOUNTER — Other Ambulatory Visit: Payer: Self-pay

## 2021-12-20 ENCOUNTER — Ambulatory Visit (AMBULATORY_SURGERY_CENTER): Payer: BC Managed Care – PPO | Admitting: Gastroenterology

## 2021-12-20 ENCOUNTER — Encounter: Payer: Self-pay | Admitting: Gastroenterology

## 2021-12-20 VITALS — BP 85/64 | HR 55 | Temp 97.5°F | Resp 14 | Ht 60.0 in | Wt 205.0 lb

## 2021-12-20 DIAGNOSIS — D120 Benign neoplasm of cecum: Secondary | ICD-10-CM

## 2021-12-20 DIAGNOSIS — K5732 Diverticulitis of large intestine without perforation or abscess without bleeding: Secondary | ICD-10-CM | POA: Diagnosis not present

## 2021-12-20 DIAGNOSIS — R1032 Left lower quadrant pain: Secondary | ICD-10-CM | POA: Diagnosis not present

## 2021-12-20 MED ORDER — SODIUM CHLORIDE 0.9 % IV SOLN: 500.0000 mL | INTRAVENOUS | Status: DC

## 2021-12-20 NOTE — Progress Notes (Signed)
A and O x3. Report to RN. Tolerated MAC anesthesia well. 

## 2021-12-20 NOTE — Progress Notes (Signed)
Pt's states no medical or surgical changes since previsit or office visit. 

## 2021-12-20 NOTE — Patient Instructions (Signed)
Start a  high fiber diet and drink at least 64 ounces of water per day. Continue current medications. Awaiting pathology results. Repeat Colonoscopy date to be determined based on pathology results.  YOU HAD AN ENDOSCOPIC PROCEDURE TODAY AT [facility]:   Refer to the procedure report that was given to you for any specific questions about what was found during the examination.  If the procedure report does not answer your questions, please call your gastroenterologist to clarify.  If you requested that your care partner not be given the details of your procedure findings, then the procedure report has been included in a sealed envelope for you to review at your convenience later.  YOU SHOULD EXPECT: Some feelings of bloating in the abdomen. Passage of more gas than usual.  Walking can help get rid of the air that was put into your GI tract during the procedure and reduce the bloating. If you had a lower endoscopy (such as a colonoscopy or flexible sigmoidoscopy) you may notice spotting of blood in your stool or on the toilet paper. If you underwent a bowel prep for your procedure, you may not have a normal bowel movement for a few days.  Please Note:  You might notice some irritation and congestion in your nose or some drainage.  This is from the oxygen used during your procedure.  There is no need for concern and it should clear up in a day or so.  SYMPTOMS TO REPORT IMMEDIATELY:  Following lower endoscopy (colonoscopy or flexible sigmoidoscopy):  Excessive amounts of blood in the stool  Significant tenderness or worsening of abdominal pains  Swelling of the abdomen that is new, acute  Fever of 100F or higher  For urgent or emergent issues, a gastroenterologist can be reached at any hour by calling [phone]. Do not use MyChart messaging for urgent concerns.    DIET:  We do recommend a small meal at first, but then you may proceed to your regular diet.  Drink plenty of  fluids but you should avoid alcoholic beverages for 24 hours.  ACTIVITY:  You should plan to take it easy for the rest of today and you should NOT DRIVE or use heavy machinery until tomorrow (because of the sedation medicines used during the test).    FOLLOW UP: Our staff will call the number listed on your records 48-72 hours following your procedure to check on you and address any questions or concerns that you may have regarding the information given to you following your procedure. If we do not reach you, we will leave a message.  We will attempt to reach you two times.  During this call, we will ask if you have developed any symptoms of COVID 19. If you develop any symptoms (ie: fever, flu-like symptoms, shortness of breath, cough etc.) before then, please call [phone].  If you test positive for Covid 19 in the 2 weeks post procedure, please call and report this information to [place].    If any biopsies were taken you will be contacted by phone or by letter within the next 1-3 weeks.  Please call us at [phone] if you have not heard about the biopsies in 3 weeks.    SIGNATURES/CONFIDENTIALITY: You and/or your care partner have signed paperwork which will be entered into your electronic medical record.  These signatures attest to the fact that that the information above on your After Visit Summary has been reviewed and is understood.  Full responsibility  of the confidentiality of this discharge information lies with you and/or your care-partner.

## 2021-12-20 NOTE — Progress Notes (Signed)
Called to room to assist during endoscopic procedure.  Patient ID and intended procedure confirmed with present staff. Received instructions for my participation in the procedure from the performing physician.  

## 2021-12-20 NOTE — Op Note (Signed)
[facility] Patient Name: Samantha Delgado Procedure Date: 12/20/2021 11:29 AM MRN: [id number] Endoscopist: Thornton Park MD, MD Age: 46 Referring MD:  Date of Birth: February 21, 1976 Gender: Female Account #: [id number] Procedure:                Colonoscopy Indications:              Follow-up of diverticulitis Medicines:                Monitored Anesthesia Care Procedure:                Pre-Anesthesia Assessment:                           - Prior to the procedure, a History and Physical                            was performed, and patient medications and                            allergies were reviewed. The patient's tolerance of                            previous anesthesia was also reviewed. The risks                            and benefits of the procedure and the sedation                            options and risks were discussed with the patient.                            All questions were answered, and informed consent                            was obtained. Prior Anticoagulants: The patient has                            taken no previous anticoagulant or antiplatelet                            agents. ASA Grade Assessment: III - A patient with                            severe systemic disease. After reviewing the risks                            and benefits, the patient was deemed in                            satisfactory condition to undergo the procedure.                           After obtaining informed consent, the colonoscope  was passed under direct vision. Throughout the                            procedure, the patient's blood pressure, pulse, and                            oxygen saturations were monitored continuously. The                            Olympus CF-HQ190L [id number]) Colonoscope was                            introduced through the anus and advanced to the 2                            cm into the ileum. The  colonoscopy was performed                            without difficulty. The patient tolerated the                            procedure well. The quality of the bowel                            preparation was good. The terminal ileum, ileocecal                            valve, appendiceal orifice, and rectum were                            photographed. Scope In: 11:33:09 AM Scope Out: 11:43:06 AM Scope Withdrawal Time: 0 hours 7 minutes 34 seconds  Total Procedure Duration: 0 hours 9 minutes 57 seconds  Findings:                 The perianal and digital rectal examinations were                            normal.                           Multiple small and large-mouthed diverticula were                            found in the entire colon.                           A 20 mm polyp was found in the cecum. The polyp was                            carpet-like. The polyp was removed with a cold                            snare in 3 pieces. Resection and retrieval were  complete. Estimated blood loss was minimal.                           The exam was otherwise without abnormality on                            direct and retroflexion views. Complications:            No immediate complications. Estimated blood loss:                            Minimal. Estimated Blood Loss:     Estimated blood loss was minimal. Impression:               - Diverticulosis in the entire examined colon.                           - One 20 mm polyp in the cecum, removed with a cold                            snare. Resected and retrieved.                           - The examination was otherwise normal on direct                            and retroflexion views. Recommendation:           - Patient has a contact number available for                            emergencies. The signs and symptoms of potential                            delayed complications were discussed with the                             patient. Return to normal activities tomorrow.                            Written discharge instructions were provided to the                            patient.                           - Follow a high fiber diet. Drink at least 64                            ounces of water daily. Add a daily stool bulking                            agent such as psyllium (an exampled would be                            Metamucil).                           -  Continue present medications.                           - Await pathology results.                           - Repeat colonoscopy date to be determined after                            pending pathology results are reviewed for                            surveillance.                           - Emerging evidence supports eating a diet of                            fruits, vegetables, grains, calcium, and yogurt                            while reducing red meat and alcohol may reduce the                            risk of colon cancer.                           - Thank you for allowing me to be involved in your                            colon cancer prevention. Thornton Park MD, MD 12/20/2021 11:47:27 AM This report has been signed electronically.

## 2021-12-20 NOTE — Progress Notes (Signed)
° °  Referring Provider: Allwardt, Randa Evens, PA-C Primary Care Physician:  Allwardt, Randa Evens, PA-C  Reason for Procedure:  Diverticulitis   IMPRESSION:  Recent diverticulitis Need for colon cancer screening Appropriate candidate for monitored anesthesia care  PLAN: Colonoscopy in the [facility] today   HPI: Samantha Delgado is a 46 y.o. female presents for colonoscopy to follow-up on a recent diagnosis of diverticulitis.    Recently found to have sigmoid diverticulitis on CT 08/26/2021 when she presented with left lower quadrant abdominal pain and leukocytosis. She was not treated with antibiotics because her provider did not think she needed them after reading a recent medical article about diverticulitis. She was subsequently treated with dicyclomine, pantoprazole, and sucralfate. The pain is not as severe but she continues to have some LLQ discomfort and bloating. Her bowel habits are better as she has adjusted her diet to more plant-based but they have not returned to normal. She doesn't feel like everything is okay. No blood or mucous. No pain with defecation. No prior colonoscopy or colon cancer screening.  No known family history of gastrointestinal malignancy and a first-degree relative or chronic gastrointestinal disorders including ulcerative colitis and Crohn's. There is a family history of ovarian cancer (maternal grandmother at 28).  Father with bladder cancer.    Past Medical History:  Diagnosis Date   Diverticulitis    CT scan   GERD (gastroesophageal reflux disease)    Heart murmur    Obesity     Past Surgical History:  Procedure Laterality Date   TONSILLECTOMY      Current Outpatient Medications  Medication Sig Dispense Refill   pantoprazole (PROTONIX) 20 MG tablet Take 1 tablet (20 mg total) by mouth daily. 30 tablet 2   No current facility-administered medications for this visit.    Allergies as of 12/20/2021   (No Known Allergies)    Family History   Problem Relation Age of Onset   Clotting disorder Mother        blood clot after surgery   Bladder Cancer Father    Diabetes Father    Hypertension Father    Hyperlipidemia Father    Heart Problems Father    Diabetes Sister    Ovarian cancer Maternal Grandmother    Multiple myeloma Maternal Grandfather    Diabetes Paternal Grandmother    Heart failure Paternal Grandmother    Colon cancer Neg Hx    Colon polyps Neg Hx      Physical Exam: General:   Alert,  well-nourished, pleasant and cooperative in NAD Head:  Normocephalic and atraumatic. Eyes:  Sclera clear, no icterus.   Conjunctiva pink. Mouth:  No deformity or lesions.   Neck:  Supple; no masses or thyromegaly. Lungs:  Clear throughout to auscultation.   No wheezes. Heart:  Regular rate and rhythm; no murmurs. Abdomen:  Soft, non-tender, nondistended, normal bowel sounds, no rebound or guarding.  Msk:  Symmetrical. No boney deformities LAD: No inguinal or umbilical LAD Extremities:  No clubbing or edema. Neurologic:  Alert and  oriented x4;  grossly nonfocal Skin:  No obvious rash or bruise. Psych:  Alert and cooperative. Normal mood and affect.     Studies/Results: No results found.    Kelsey Durflinger L. Tarri Glenn, MD, MPH 12/20/2021, 10:59 AM

## 2021-12-22 ENCOUNTER — Telehealth: Payer: Self-pay

## 2021-12-22 NOTE — Telephone Encounter (Signed)
°  Follow up Call-  Call back number 12/20/2021  Post procedure Call Back phone  # [phone]  Permission to leave phone message Yes  Some recent data might be hidden     Patient questions:  Do you have a fever, pain , or abdominal swelling? No. Pain Score  0 *  Have you tolerated food without any problems? Yes.    Have you been able to return to your normal activities? Yes.    Do you have any questions about your discharge instructions: Diet   No. Medications  No. Follow up visit  No.  Do you have questions or concerns about your Care? No.  Actions: * If pain score is 4 or above: No action needed, pain <4.

## 2021-12-31 ENCOUNTER — Encounter: Payer: Self-pay | Admitting: Physician Assistant

## 2022-01-19 ENCOUNTER — Telehealth: Payer: Self-pay

## 2022-01-19 DIAGNOSIS — Z86010 Personal history of colonic polyps: Secondary | ICD-10-CM

## 2022-01-19 NOTE — Telephone Encounter (Signed)
Recall colon, called pt to schedule colon @ [facility] in end of April beginning of May to eval polypectomy site. PV will be required. LVM requesting returned call. ?

## 2022-01-20 ENCOUNTER — Other Ambulatory Visit: Payer: Self-pay

## 2022-01-20 ENCOUNTER — Ambulatory Visit (INDEPENDENT_AMBULATORY_CARE_PROVIDER_SITE_OTHER): Payer: BC Managed Care – PPO | Admitting: Physician Assistant

## 2022-01-20 ENCOUNTER — Encounter: Payer: Self-pay | Admitting: Physician Assistant

## 2022-01-20 VITALS — BP 123/77 | HR 60 | Temp 98.3°F | Wt 207.4 lb

## 2022-01-20 DIAGNOSIS — K5730 Diverticulosis of large intestine without perforation or abscess without bleeding: Secondary | ICD-10-CM | POA: Diagnosis not present

## 2022-01-20 DIAGNOSIS — K219 Gastro-esophageal reflux disease without esophagitis: Secondary | ICD-10-CM | POA: Diagnosis not present

## 2022-01-20 NOTE — Progress Notes (Signed)
? ?Subjective:  ? ? Patient ID: Samantha Delgado, female    DOB: 25-Dec-1975, 46 y.o.   MRN: [id number] ? ?Chief Complaint  ?Patient presents with  ? Gastroesophageal Reflux  ? diviticulitis  ?  1/30 had a colonoscopy, diagnosed with diverticulitis and also precancerous polyps. Has repeat colonoscopy in May   ? ? ?HPI ?Patient is in today for recheck on GERD.  Patient was previously taking Protonix 20 mg.  She says that she has not needed this medication in quite a few months.  She went to a nutritionist with her daughter and has made some changes to her lifestyle.  She has been having incorporating more fiber in her diet and doing lower acid foods and drinks.  She has not been eating late at night and she has been having food with coffee.  She thinks this has made a big difference in her reflux symptoms and she is happy to not be taking medicine every day.  She is scheduled for repeat colonoscopy in May of this year due to 1 tubular adenoma that was found. ? ?No other complaints or concerns today. ? ?Past Medical History:  ?Diagnosis Date  ? Diverticulitis   ? CT scan  ? Diverticulitis   ? GERD (gastroesophageal reflux disease)   ? Heart murmur   ? Obesity   ? ? ?Past Surgical History:  ?Procedure Laterality Date  ? TONSILLECTOMY    ? ? ?Family History  ?Problem Relation Age of Onset  ? Clotting disorder Mother   ?     blood clot after surgery  ? Bladder Cancer Father   ? Diabetes Father   ? Hypertension Father   ? Hyperlipidemia Father   ? Heart Problems Father   ? Diabetes Sister   ? Ovarian cancer Maternal Grandmother   ? Multiple myeloma Maternal Grandfather   ? Diabetes Paternal Grandmother   ? Heart failure Paternal Grandmother   ? Colon cancer Neg Hx   ? Colon polyps Neg Hx   ? ? ?Social History  ? ?Tobacco Use  ? Smoking status: Former  ?  Years: 4.00  ?  Types: Cigarettes  ?  Quit date: 1997  ?  Years since quitting: 26.1  ? Smokeless tobacco: Never  ?Vaping Use  ? Vaping Use: Never used  ?Substance Use  Topics  ? Alcohol use: Yes  ?  Comment: occasional beer  ? Drug use: Not Currently  ?  ? ?No Known Allergies ? ?Review of Systems ?NEGATIVE UNLESS OTHERWISE INDICATED IN HPI ? ? ?   ?Objective:  ?  ? ?BP 123/77   Pulse 60   Temp 98.3 ?F (36.8 ?C) (Temporal)   Wt 207 lb 6.4 oz (94.1 kg)   SpO2 97%   BMI 40.51 kg/m?  ? ?Wt Readings from Last 3 Encounters:  ?01/20/22 207 lb 6.4 oz (94.1 kg)  ?12/20/21 205 lb (93 kg)  ?12/06/21 205 lb (93 kg)  ? ? ?BP Readings from Last 3 Encounters:  ?01/20/22 123/77  ?12/20/21 (!) 85/64  ?09/13/21 110/80  ?  ? ?Physical Exam ?Vitals and nursing note reviewed.  ?Constitutional:   ?   Appearance: Normal appearance. She is obese. She is not toxic-appearing.  ?HENT:  ?   Head: Normocephalic and atraumatic.  ?   Right Ear: External ear normal.  ?   Left Ear: External ear normal.  ?   Nose: Nose normal.  ?   Mouth/Throat:  ?   Mouth: Mucous membranes are moist.  ?  Eyes:  ?   Extraocular Movements: Extraocular movements intact.  ?   Conjunctiva/sclera: Conjunctivae normal.  ?   Pupils: Pupils are equal, round, and reactive to light.  ?Cardiovascular:  ?   Rate and Rhythm: Normal rate and regular rhythm.  ?   Pulses: Normal pulses.  ?   Heart sounds: Normal heart sounds.  ?Pulmonary:  ?   Effort: Pulmonary effort is normal.  ?   Breath sounds: Normal breath sounds.  ?Musculoskeletal:     ?   General: Normal range of motion.  ?   Cervical back: Normal range of motion and neck supple.  ?Skin: ?   General: Skin is warm and dry.  ?Neurological:  ?   General: No focal deficit present.  ?   Mental Status: She is alert and oriented to person, place, and time.  ?Psychiatric:     ?   Mood and Affect: Mood normal.     ?   Behavior: Behavior normal.     ?   Thought Content: Thought content normal.     ?   Judgment: Judgment normal.  ? ? ?   ?Assessment & Plan:  ? ?Problem List Items Addressed This Visit   ?None ?Visit Diagnoses   ? ? GERD without esophagitis    -  Primary  ? Diverticulosis of colon       ? ?  ? ? ?1. GERD without esophagitis ?Resolved with dietary and lifestyle changes.  No longer taking daily PPI.  She will call if anything changes. ? ?2. Diverticulosis of colon ?I personally reviewed her colonoscopy and pathology report from 12/20/2021.  1 tubular adenoma that was removed in pieces and Dr. Tarri Glenn recommended surveillance colonoscopy in May of this year.  She is already scheduled for this. ? ? ?Plan to follow-up in 1 year for fasting labs and CPE or as needed. ? ? ?Mccartney Brucks M Bradan Congrove, PA-C ?

## 2022-01-20 NOTE — Telephone Encounter (Signed)
SECOND ATTEMPT: ? ?Called pt to schedule repeat colonoscopy to eval polypectomy site. Pt scheduled @ [facility] on 03/21/22 @ 8am, arrival time 7am. Prep instructions sent via My Chart per pt request. Amb referral placed for auth purposes. ?

## 2022-03-21 ENCOUNTER — Encounter: Payer: Self-pay | Admitting: Gastroenterology

## 2022-03-21 ENCOUNTER — Ambulatory Visit (AMBULATORY_SURGERY_CENTER): Payer: BC Managed Care – PPO | Admitting: Gastroenterology

## 2022-03-21 VITALS — BP 112/64 | HR 52 | Temp 97.7°F | Resp 20 | Ht 59.0 in | Wt 207.0 lb

## 2022-03-21 DIAGNOSIS — Z86010 Personal history of colonic polyps: Secondary | ICD-10-CM | POA: Diagnosis not present

## 2022-03-21 MED ORDER — SODIUM CHLORIDE 0.9 % IV SOLN: 500.0000 mL | Freq: Once | INTRAVENOUS | Status: DC

## 2022-03-21 NOTE — Op Note (Signed)
[place] ?Patient Name: Samantha Delgado ?Procedure Date: 03/21/2022 7:49 AM ?MRN: [id number] ?Endoscopist: Thornton Park MD, MD ?Age: 46 ?Referring MD:  ?Date of Birth: 05/17/1976 ?Gender: Female ?Account #: [id number] ?Procedure:                Colonoscopy ?Indications:              Surveillance: History of piecemeal removal adenoma  ?                          on last colonoscopy (< 3 yrs) ?                          25m cecal tubular adenoma removed in piecemeal  ?                          fashion 12/20/21 ?Medicines:                Monitored Anesthesia Care ?Procedure:                Pre-Anesthesia Assessment: ?                          - Prior to the procedure, a History and Physical  ?                          was performed, and patient medications and  ?                          allergies were reviewed. The patient's tolerance of  ?                          previous anesthesia was also reviewed. The risks  ?                          and benefits of the procedure and the sedation  ?                          options and risks were discussed with the patient.  ?                          All questions were answered, and informed consent  ?                          was obtained. Prior Anticoagulants: The patient has  ?                          taken no previous anticoagulant or antiplatelet  ?                          agents. ASA Grade Assessment: III - A patient with  ?                          severe systemic disease. After reviewing the risks  ?  and benefits, the patient was deemed in  ?                          satisfactory condition to undergo the procedure. ?                          After obtaining informed consent, the colonoscope  ?                          was passed under direct vision. Throughout the  ?                          procedure, the patient's blood pressure, pulse, and  ?                          oxygen saturations were monitored continuously. The  ?                           Olympus CF-HQ190L ([id number]) Colonoscope was  ?                          introduced through the anus and advanced to the 3  ?                          cm into the ileum. A second forward view of the  ?                          right colon was performed. The colonoscopy was  ?                          performed without difficulty. The patient tolerated  ?                          the procedure well. The quality of the bowel  ?                          preparation was excellent. The terminal ileum,  ?                          ileocecal valve, appendiceal orifice, and rectum  ?                          were photographed. ?Scope In: 8:06:01 AM ?Scope Out: 8:16:38 AM ?Scope Withdrawal Time: 0 hours 8 minutes 50 seconds  ?Total Procedure Duration: 0 hours 10 minutes 37 seconds  ?Findings:                 The perianal and digital rectal examinations were  ?                          normal. ?                          Multiple small and large-mouthed diverticula were  ?  found in the entire colon. ?                          A localized area of scarred mucosa was found in the  ?                          cecum at the location of the prior polypectomy. No  ?                          residual polyp seen. ?                          The exam was otherwise without abnormality on  ?                          direct and retroflexion views. ?Complications:            No immediate complications. ?Estimated Blood Loss:     Estimated blood loss: none. ?Impression:               - Diverticulosis in the entire examined colon. ?                          - Scarred mucosa in the cecum. ?                          - The examination was otherwise normal on direct  ?                          and retroflexion views. ?                          - No specimens collected. ?Recommendation:           - Patient has a contact number available for  ?                          emergencies. The signs and symptoms of  potential  ?                          delayed complications were discussed with the  ?                          patient. Return to normal activities tomorrow.  ?                          Written discharge instructions were provided to the  ?                          patient. ?                          - Follow a high fiber diet. Drink at least 64  ?                          ounces of water daily. Add a daily stool bulking  ?  agent such as psyllium (an exampled would be  ?                          Metamucil). ?                          - Continue present medications. ?                          - Repeat colonoscopy in 3 years for surveillance,  ?                          earlier with new symptoms. ?                          - Emerging evidence supports eating a diet of  ?                          fruits, vegetables, grains, calcium, and yogurt  ?                          while reducing red meat and alcohol may reduce the  ?                          risk of colon cancer. ?Thornton Park MD, MD ?03/21/2022 8:22:31 AM ?This report has been signed electronically. ?

## 2022-03-21 NOTE — Patient Instructions (Signed)
Thank you for coming to see [place] today. ?Recommend next surveillance colonoscopy in 3 years.  Please contact us if any issues or concerns arise before that time. ? ? ?YOU HAD AN ENDOSCOPIC PROCEDURE TODAY AT [facility]:   Refer to the procedure report that was given to you for any specific questions about what was found during the examination.  If the procedure report does not answer your questions, please call your gastroenterologist to clarify.  If you requested that your care partner not be given the details of your procedure findings, then the procedure report has been included in a sealed envelope for you to review at your convenience later. ? ?YOU SHOULD EXPECT: Some feelings of bloating in the abdomen. Passage of more gas than usual.  Walking can help get rid of the air that was put into your GI tract during the procedure and reduce the bloating. If you had a lower endoscopy (such as a colonoscopy or flexible sigmoidoscopy) you may notice spotting of blood in your stool or on the toilet paper. If you underwent a bowel prep for your procedure, you may not have a normal bowel movement for a few days. ? ?Please Note:  You might notice some irritation and congestion in your nose or some drainage.  This is from the oxygen used during your procedure.  There is no need for concern and it should clear up in a day or so. ? ?SYMPTOMS TO REPORT IMMEDIATELY: ? ?Following lower endoscopy (colonoscopy or flexible sigmoidoscopy): ? Excessive amounts of blood in the stool ? Significant tenderness or worsening of abdominal pains ? Swelling of the abdomen that is new, acute ? Fever of 100?F or higher ? ? ?For urgent or emergent issues, a gastroenterologist can be reached at any hour by calling [phone]. ?Do not use MyChart messaging for urgent concerns.  ? ? ?DIET:  We do recommend a small meal at first, but then you may proceed to your regular diet.  Drink plenty of fluids but you should avoid alcoholic  beverages for 24 hours. ? ?ACTIVITY:  You should plan to take it easy for the rest of today and you should NOT DRIVE or use heavy machinery until tomorrow (because of the sedation medicines used during the test).   ? ?FOLLOW UP: ?Our staff will call the number listed on your records 48-72 hours following your procedure to check on you and address any questions or concerns that you may have regarding the information given to you following your procedure. If we do not reach you, we will leave a message.  We will attempt to reach you two times.  During this call, we will ask if you have developed any symptoms of COVID 19. If you develop any symptoms (ie: fever, flu-like symptoms, shortness of breath, cough etc.) before then, please call [phone].  If you test positive for Covid 19 in the 2 weeks post procedure, please call and report this information to [place].   ? ?If any biopsies were taken you will be contacted by phone or by letter within the next 1-3 weeks.  Please call us at [phone] if you have not heard about the biopsies in 3 weeks.  ? ? ?SIGNATURES/CONFIDENTIALITY: ?You and/or your care partner have signed paperwork which will be entered into your electronic medical record.  These signatures attest to the fact that that the information above on your After Visit Summary has been reviewed and is understood.  Full responsibility of the  confidentiality of this discharge information lies with you and/or your care-partner.  ?

## 2022-03-21 NOTE — Progress Notes (Signed)
? ?  Referring Provider: Allwardt, Randa Evens, PA-C ?Primary Care Physician:  Allwardt, Randa Evens, PA-C ? ?Indication for Procedure: Polypectomy site surveillance ? ? ?IMPRESSION:  ?Large cecal polyp removed in piecemeal resection 12/20/2021 ?Repeat exam recommended to confirm complete resection ?Appropriate candidate for monitored anesthesia care ? ?PLAN: ?Colonoscopy in the [facility] today ? ? ?HPI: Samantha Delgado is a 46 y.o. female presents for surveillance colonoscopy. ? ?Prior endoscopic history: ?Colonoscopy 12/20/21: ?- The perianal and digital rectal examinations were normal. ?- Multiple small and large-mouthed diverticula were found in the entire colon. ?- A 20 mm polyp was found in the cecum. The polyp was carpet-like. The polyp was ?removed with a cold snare in 3 pieces. Resection and retrieval were complete. Estimated ?blood loss was minimal.Pathology showed this to be a tubular adenoma. ?- The exam was otherwise without abnormality on direct and retroflexion views. ? ?Surveillance colonoscopy recommended in 3 to 6 months. ? ?No known family history of gastrointestinal malignancy and a first-degree relative or chronic gastrointestinal disorders including ulcerative colitis and Crohn's. There is a family history of ovarian cancer (maternal grandmother at 43).  Father with bladder cancer.  ?  ? ? ?Past Medical History:  ?Diagnosis Date  ? Diverticulitis   ? CT scan  ? Diverticulitis   ? GERD (gastroesophageal reflux disease)   ? Heart murmur   ? Obesity   ? ? ?Past Surgical History:  ?Procedure Laterality Date  ? TONSILLECTOMY    ? ? ?Current Outpatient Medications  ?Medication Sig Dispense Refill  ? pantoprazole (PROTONIX) 20 MG tablet Take 1 tablet (20 mg total) by mouth daily. 30 tablet 2  ? ?Current Facility-Administered Medications  ?Medication Dose Route Frequency Provider Last Rate Last Admin  ? 0.9 %  sodium chloride infusion  500 mL Intravenous Once Thornton Park, MD      ? ? ?Allergies as of 03/21/2022   ? (No Known Allergies)  ? ? ?Family History  ?Problem Relation Age of Onset  ? Clotting disorder Mother   ?     blood clot after surgery  ? Bladder Cancer Father   ? Diabetes Father   ? Hypertension Father   ? Hyperlipidemia Father   ? Heart Problems Father   ? Diabetes Sister   ? Ovarian cancer Maternal Grandmother   ? Multiple myeloma Maternal Grandfather   ? Diabetes Paternal Grandmother   ? Heart failure Paternal Grandmother   ? Colon cancer Neg Hx   ? Colon polyps Neg Hx   ? ? ? ?Physical Exam: ?General:   Alert,  well-nourished, pleasant and cooperative in NAD ?Head:  Normocephalic and atraumatic. ?Eyes:  Sclera clear, no icterus.   Conjunctiva pink. ?Mouth:  No deformity or lesions.   ?Neck:  Supple; no masses or thyromegaly. ?Lungs:  Clear throughout to auscultation.   No wheezes. ?Heart:  Regular rate and rhythm; no murmurs. ?Abdomen:  Soft, non-tender, nondistended, normal bowel sounds, no rebound or guarding.  ?Msk:  Symmetrical. No boney deformities ?LAD: No inguinal or umbilical LAD ?Extremities:  No clubbing or edema. ?Neurologic:  Alert and  oriented x4;  grossly nonfocal ?Skin:  No obvious rash or bruise. ?Psych:  Alert and cooperative. Normal mood and affect. ? ? ? ? ?Studies/Results: ?No results found. ? ? ? ?Roslyn Else L. Tarri Glenn, MD, MPH ?03/21/2022, 7:51 AM ? ? ?  ?

## 2022-03-21 NOTE — Progress Notes (Signed)
PT taken to PACU. Monitors in place. VSS. Report given to RN. 

## 2022-03-22 ENCOUNTER — Telehealth: Payer: Self-pay | Admitting: Gastroenterology

## 2022-03-22 ENCOUNTER — Other Ambulatory Visit: Payer: Self-pay

## 2022-03-22 DIAGNOSIS — R1084 Generalized abdominal pain: Secondary | ICD-10-CM

## 2022-03-22 MED ORDER — DICYCLOMINE HCL 10 MG PO CAPS: 10.0000 mg | ORAL_CAPSULE | Freq: Four times a day (QID) | ORAL | 1 refills | Status: DC | PRN

## 2022-03-22 MED ORDER — SIMETHICONE 80 MG PO CHEW: 80.0000 mg | CHEWABLE_TABLET | Freq: Four times a day (QID) | ORAL | 0 refills | Status: DC | PRN

## 2022-03-22 NOTE — Telephone Encounter (Signed)
Returned pt's call. States she is having 6/10 abdominal pain that feels like gassy and cramping pains. States it is worse on the left side but is felt on the right too. States it is very tender if she pushes on her abdomen and she feels bloated. States it is worse with movement. "If I'm lying really still it doesn't hurt as much but if I try to get up and move it hurts worse." States she felt that she passed an adequate amount of gas yesterday. States she had a colonoscopy 6 months ago and felt that she passed the same amount of gas as that time and she reports she did not have any complications like this with her last procedure. She also reports she has tried using a heating pad but has not had any relief or improvement in her pain with using that. Encouraged pt to try and walk around as this might help any air that might be trapped. Also encouraged warm liquids and using GasX. Instructed that Dr. Tarri Glenn would be made aware of pt's complaints and a nurse would call her back with her recommendations.   ?

## 2022-03-22 NOTE — Telephone Encounter (Signed)
Returned pt call to inquire further about her symptoms. Denies having any fever, chills or rectal bleeding. States she does not have an appetite but is able to tolerate fluids without any s/sx of N/V. Denies symptoms feeling worse after drinking her fluids. Does have h/o back pain. States her back discomfort does make it difficult for her to move around. States she is noticing some improvement with her gas pain and bloating. Denies having any abd firmness. States "it feels more achy than painful". Has had 1 loose stool last night without rectal bleeding, but has not defecated today. States she does notice active bowel sounds like she needs to defecate but has not yet done so. States she has been able to soak in a warm tub which has temporarily relieved her discomfort. Would like to trial Dicyclomine. Is currently waiting for her husband to return with OTC GasX. Agreeable to being seen by APP tomorrow for further evaluation. Scheduled with Nicoletta Ba, PA-C 03/23/22 @ 130pm. Rx sent to pt preferred pharmacy. Advised she proceed to the ED for further evaluation and treatment if her abd pain and discomfort worsens, develops rectal bleeding, firmness to her abd. Routing this message to providers for continuity of care purposes. ?

## 2022-03-22 NOTE — Telephone Encounter (Signed)
Patient called states she is having super tender to touch on her abdomen area and feels bloated is trying to pass gas but it is really painful. ?

## 2022-03-23 ENCOUNTER — Encounter: Payer: Self-pay | Admitting: Physician Assistant

## 2022-03-23 ENCOUNTER — Ambulatory Visit: Payer: BC Managed Care – PPO | Admitting: Physician Assistant

## 2022-03-23 ENCOUNTER — Ambulatory Visit (INDEPENDENT_AMBULATORY_CARE_PROVIDER_SITE_OTHER)
Admission: RE | Admit: 2022-03-23 | Discharge: 2022-03-23 | Disposition: A | Discharge status: Home / Self Care | Payer: BC Managed Care – PPO | Source: Ambulatory Visit | Attending: Physician Assistant | Admitting: Physician Assistant

## 2022-03-23 ENCOUNTER — Other Ambulatory Visit (INDEPENDENT_AMBULATORY_CARE_PROVIDER_SITE_OTHER): Payer: BC Managed Care – PPO

## 2022-03-23 VITALS — BP 110/64 | HR 80 | Ht 59.0 in | Wt 204.4 lb

## 2022-03-23 DIAGNOSIS — R109 Unspecified abdominal pain: Secondary | ICD-10-CM

## 2022-03-23 DIAGNOSIS — K5792 Diverticulitis of intestine, part unspecified, without perforation or abscess without bleeding: Secondary | ICD-10-CM | POA: Diagnosis not present

## 2022-03-23 LAB — CBC WITH DIFFERENTIAL/PLATELET
Basophils Absolute: 0.1 10*3/uL (ref 0.0–0.1)
Basophils Relative: 0.7 % (ref 0.0–3.0)
Eosinophils Absolute: 0.2 10*3/uL (ref 0.0–0.7)
Eosinophils Relative: 1.8 % (ref 0.0–5.0)
HCT: 37.3 % (ref 36.0–46.0)
Hemoglobin: 12.2 g/dL (ref 12.0–15.0)
Lymphocytes Relative: 20.5 % (ref 12.0–46.0)
Lymphs Abs: 2.3 10*3/uL (ref 0.7–4.0)
MCHC: 32.8 g/dL (ref 30.0–36.0)
MCV: 82.8 fl (ref 78.0–100.0)
Monocytes Absolute: 0.6 10*3/uL (ref 0.1–1.0)
Monocytes Relative: 5.7 % (ref 3.0–12.0)
Neutro Abs: 7.9 10*3/uL — ABNORMAL HIGH (ref 1.4–7.7)
Neutrophils Relative %: 71.3 % (ref 43.0–77.0)
Platelets: 302 10*3/uL (ref 150.0–400.0)
RBC: 4.5 Mil/uL (ref 3.87–5.11)
RDW: 15.6 % — ABNORMAL HIGH (ref 11.5–15.5)
WBC: 11 10*3/uL — ABNORMAL HIGH (ref 4.0–10.5)

## 2022-03-23 MED ORDER — CIPROFLOXACIN HCL 500 MG PO TABS
500.0000 mg | ORAL_TABLET | Freq: Two times a day (BID) | ORAL | 0 refills | Status: AC
Start: 2022-03-23 — End: 2022-03-30

## 2022-03-23 MED ORDER — METRONIDAZOLE 500 MG PO TABS: 500.0000 mg | ORAL_TABLET | Freq: Two times a day (BID) | ORAL | 0 refills | Status: AC

## 2022-03-23 NOTE — Progress Notes (Signed)
Reviewed and agree with management plans. ? ?Zaineb Nowaczyk L. Alixandra Alfieri, MD, MPH  ?

## 2022-03-23 NOTE — Progress Notes (Signed)
? ?Subjective:  ? ? Patient ID: Samantha Delgado, female    DOB: November 28, 1975, 46 y.o.   MRN: [id number] ? ?HPI ?Samantha Delgado is a pleasant 46 year old white female, established with Dr. Tarri Glenn, who comes in with complaints of left-sided abdominal pain onset after colonoscopy 2 days ago on 03/21/2022. ?Colonoscopy was done for history of adenomatous polyps.  She was found to have multiple small diverticuli throughout the colon, there was a scar noted at the cecum from prior polypectomy site and otherwise negative exam. ?Patient says she felt bloated and uncomfortable after she went home from the procedure, then later in the evening developed some abdominal cramping.  Yesterday she continued to feel uncomfortable and her pain localized more to the left mid abdomen, where it has stayed.  She also continues to feel bloated.  She has not had any fever or chills, she has been able to pass flatus and had a small bowel movement today.  She says she passed a little bit of blood on Monday has not seen any blood since then.  She feels her pain is a bit worse with a deep breath.  She also has had a recent back injury which she is feeling more on the left as well.  Patient did have an episode of diverticulitis in October 2022 and says this pain feels similar. ? ?Review of Systems Pertinent positive and negative review of systems were noted in the above HPI section.  All other review of systems was otherwise negative.  ? ?Outpatient Encounter Medications as of 03/23/2022  ?Medication Sig  ? ciprofloxacin (CIPRO) 500 MG tablet Take 1 tablet (500 mg total) by mouth 2 (two) times daily for 7 days.  ? dicyclomine (BENTYL) 10 MG capsule Take 1 capsule (10 mg total) by mouth 4 (four) times daily as needed for spasms.  ? metroNIDAZOLE (FLAGYL) 500 MG tablet Take 1 tablet (500 mg total) by mouth 2 (two) times daily for 7 days.  ? simethicone (GAS-X) 80 MG chewable tablet Chew 1 tablet (80 mg total) by mouth every 6 (six) hours as needed for  flatulence.  ? [DISCONTINUED] pantoprazole (PROTONIX) 20 MG tablet Take 1 tablet (20 mg total) by mouth daily.  ? ?No facility-administered encounter medications on file as of 03/23/2022.  ? ?No Known Allergies ?There are no problems to display for this patient. ? ?Social History  ? ?Socioeconomic History  ? Marital status: Single  ?  Spouse name: Not on file  ? Number of children: 1  ? Years of education: Not on file  ? Highest education level: Not on file  ?Occupational History  ? Occupation: Training and development officer  ?  Comment: [employer]  ?Tobacco Use  ? Smoking status: Former  ?  Years: 4.00  ?  Types: Cigarettes  ?  Quit date: 1997  ?  Years since quitting: 26.3  ? Smokeless tobacco: Never  ?Vaping Use  ? Vaping Use: Never used  ?Substance and Sexual Activity  ? Alcohol use: Yes  ?  Comment: occasional beer  ? Drug use: Not Currently  ? Sexual activity: Not on file  ?Other Topics Concern  ? Not on file  ?Social History Narrative  ? Not on file  ? ?Social Determinants of Health  ? ?Financial Resource Strain: Not on file  ?Food Insecurity: Not on file  ?Transportation Needs: Not on file  ?Physical Activity: Not on file  ?Stress: Not on file  ?Social Connections: Not on file  ?Intimate Partner Violence: Not on file  ? ? ?  Samantha Delgado family history includes Bladder Cancer in her father; Clotting disorder in her mother; Diabetes in her father, paternal grandmother, and sister; Heart Problems in her father; Heart failure in her paternal grandmother; Hyperlipidemia in her father; Hypertension in her father; Multiple myeloma in her maternal grandfather; Ovarian cancer in her maternal grandmother. ? ? ?   ?Objective:  ?  ?Vitals:  ? 03/23/22 1339  ?BP: 110/64  ?Pulse: 80  ? ? ?Physical Exam  Well-developed well-nourished  WF  in no acute distress.  , Weight,204  BMI 41 ? ?HEENT; nontraumatic normocephalic, EOMI, PE R LA, sclera anicteric. ?Oropharynx; not examined today  ?Neck; supple, no JVD ?Cardiovascular; regular  rate and rhythm with S1-S2, no murmur rub or gallop ?Pulmonary; Clear bilaterally ?Abdomen; soft,there is tenderness  in the left mid abdomen, no guarding , nondistended, no palpable mass or hepatosplenomegaly, bowel sounds are active ?Rectal;not done ?Skin; benign exam, no jaundice rash or appreciable lesions ?Extremities; no clubbing cyanosis or edema skin warm and dry ?Neuro/Psych; alert and oriented x4, grossly nonfocal mood and affect appropriate  ? ? ? ?   ?Assessment & Plan:  ? ?#75 46 year old white female with persistent abdominal pain, 48 hours post colonoscopy on 03/21/2022. ?Pain has localized to the left mid abdomen, no associated fever or chills, she does continue to feel bloated, has been passing flatus and had a small bowel movement today.  Pain reminiscent of previous episode of diverticulitis October 2022. ? ?She did not have any biopsies or polypectomy done with this colonoscopy, was found to have multiple small diverticuli throughout the colon. ? ?I suspect she has developed diverticulitis postprocedure, symptoms and exam not consistent with perforation, rule out contusion ? ?#2 history of adenomatous colon polyp-no polyps found at most recent colonoscopy 03/21/2022 ?#3 prior history of diverticulitis ? ?Plan; CBC with differential today ?KUB 2 view today ?Start Cipro 500 mg p.o. twice daily x7 days, and metronidazole 500 mg p.o. twice daily x7 days both to be taken with food ?She can continue to use the Bentyl that was called in for her 10 mg p.o. 3 times daily as needed for abdominal pain/cramping ?Light diet ?I have given her a note for work to cover today and tomorrow. ?She is asked to call back and speak to nurse should she have any worsening of symptoms, and also want to know if she has not improved in the next couple of days. ? ?Gwenda Heiner S Cortne Amara PA-C ?03/23/2022 ? ? ?Cc: Allwardt, Alyssa M, PA-C ?  ?

## 2022-03-23 NOTE — Patient Instructions (Signed)
If you are age 45 or younger, your body mass index should be between 19-25. Your Body mass index is 41.28 kg/m?Marland Kitchen If this is out of the aformentioned range listed, please consider follow up with your Primary Care Provider.  ?________________________________________________________ ? ?The [facility] providers would like to encourage you to use [facility] to communicate with providers for non-urgent requests or questions.  Due to long hold times on the telephone, sending your provider a message by [facility] may be a faster and more efficient way to get a response.  Please allow 48 business hours for a response.  Please remember that this is for non-urgent requests.  ?_______________________________________________________ ? ?Your provider has requested that you go to the basement level for lab work before leaving today. Press "B" on the elevator. The lab is located at the first door on the left as you exit the elevator. ? ?START Cipro 500 mg 1 tablet twice daily for 7 days ?START Flagyl 500 mg 1 tablet twice daily for 7 days. ? ?Continue Dicyclomine 10 mg  ? ?Thank you for entrusting me with your care and choosing [facility]. ? ?Nicoletta Ba, PA-C ?

## 2022-03-23 NOTE — Progress Notes (Signed)
Please see if we can get an interpretation of her abdominal x-rays this afternoon. Thanks.  ?

## 2022-03-24 NOTE — Progress Notes (Signed)
Thank you :)

## 2022-05-30 ENCOUNTER — Encounter: Payer: Self-pay | Admitting: Gastroenterology

## 2022-08-15 ENCOUNTER — Encounter: Payer: Self-pay | Admitting: *Deleted

## 2022-08-30 IMAGING — DX DG ABDOMEN 1V
1 series · 1 of 1 positions shown · non-contrast
Comparison: None.

CLINICAL DATA: Left lower quadrant pain

EXAM:
ABDOMEN - 1 VIEW

[abdomen kub]
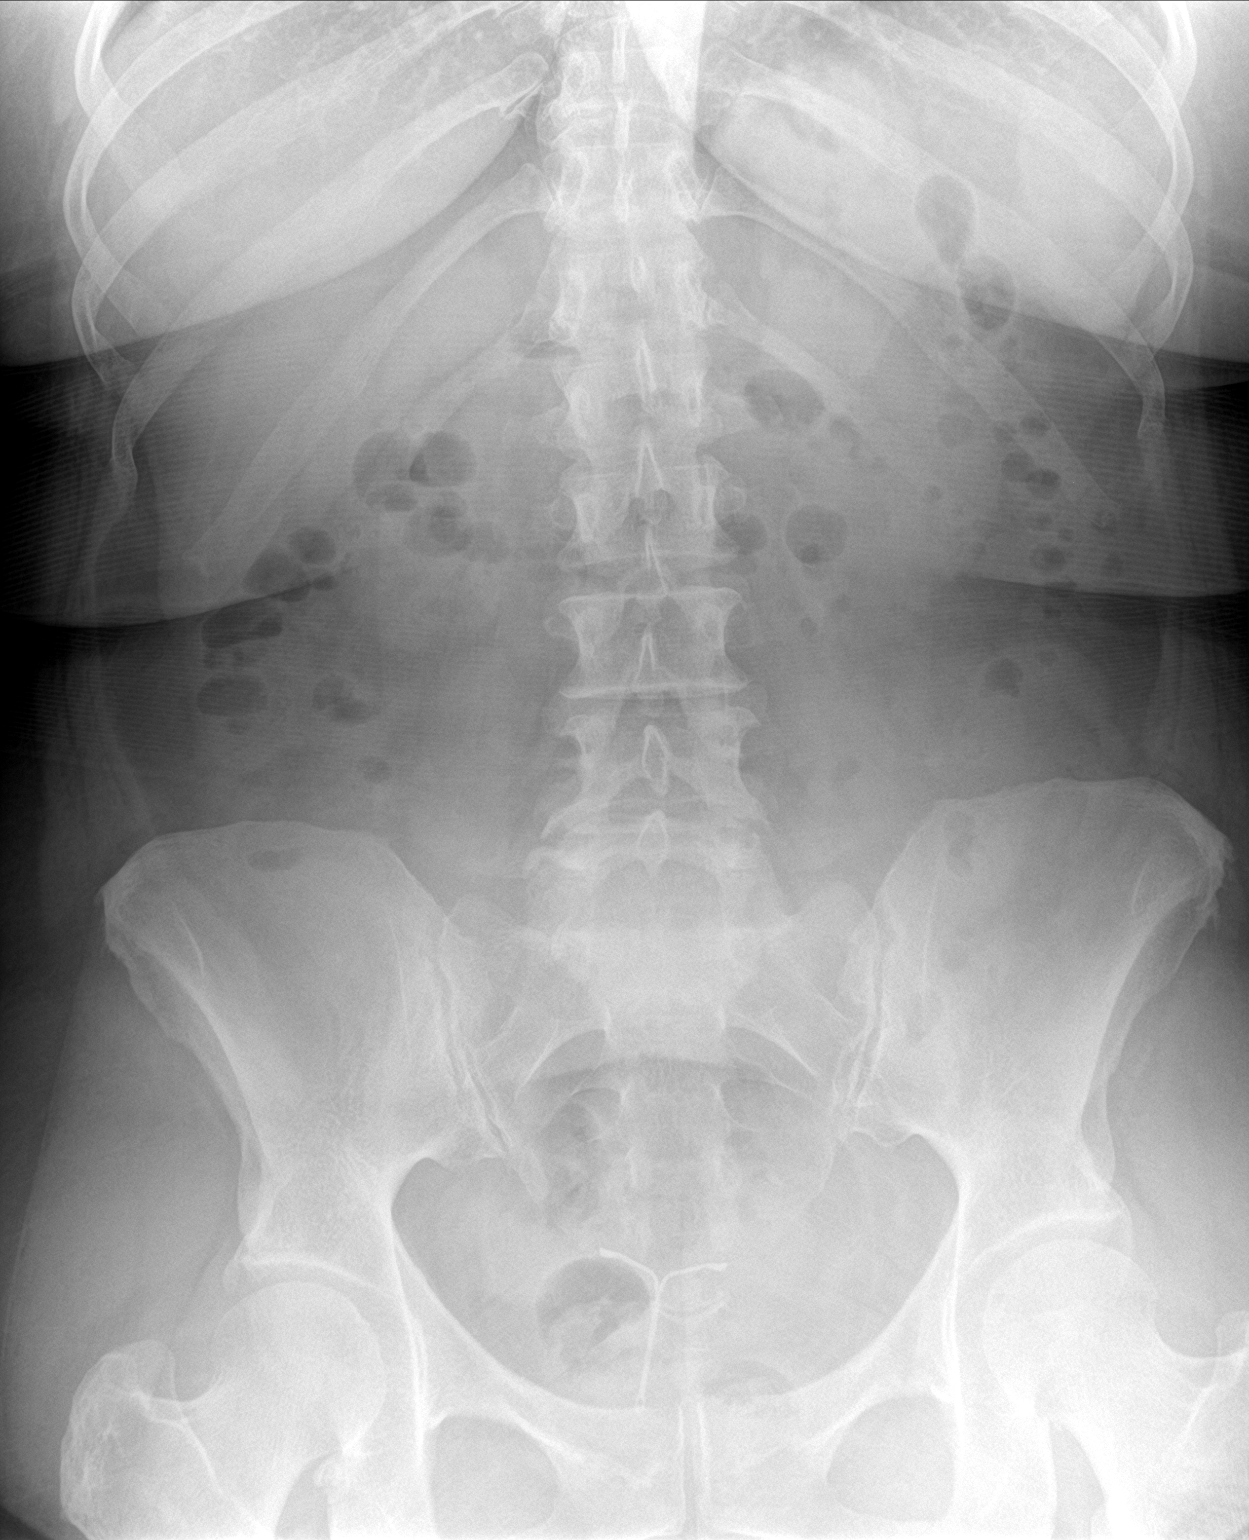

[1 of 1 positions shown; findings below may reference images not displayed]

FINDINGS: The bowel gas pattern is normal. IUD noted in pelvis. No
radio-opaque calculi or other significant radiographic abnormality
are seen.
IMPRESSION: Negative.

## 2022-09-11 IMAGING — CT CT ABD-PELV W/ CM
2 of 5 series · 16 of 46 positions shown, 18 images · IV contrast (APPLIED)
Comparison: X-ray abdomen 08/13/2021.

CLINICAL DATA: Patient complains of left lower quadrant abdominal
pain.

EXAM:
CT ABDOMEN AND PELVIS WITH CONTRAST
TECHNIQUE: Multidetector CT imaging of the abdomen and pelvis was performed
using the standard protocol following bolus administration of
intravenous contrast.
CONTRAST:  75mL OMNIPAQUE IOHEXOL 350 MG/ML SOLN

[Series 2: axial st · axial · 0.67mm/px · z∈[-465,-85]mm · 13 of 88 slices shown, 15 images]
[im 6/88  soft-tissue]
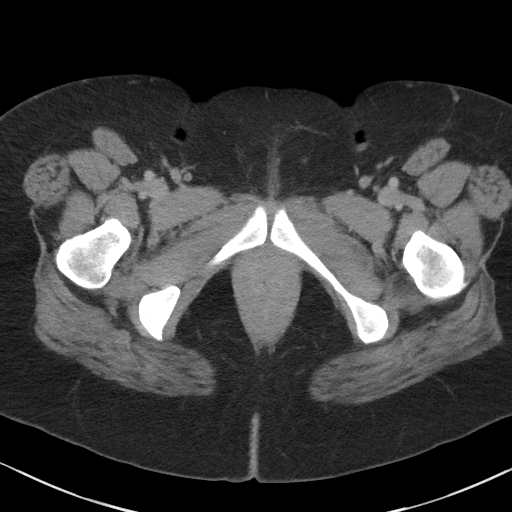
[im 6/88  bone]
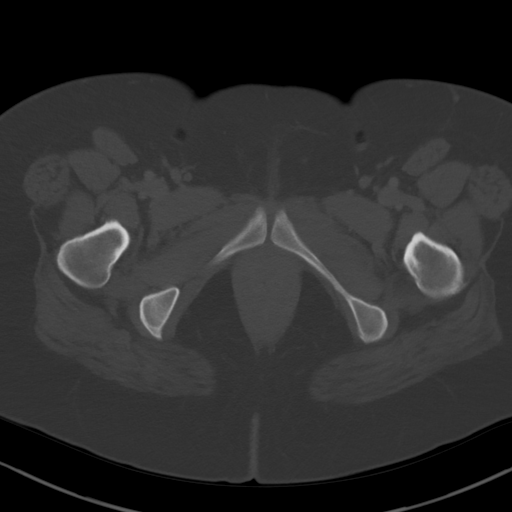
[im 12/88  soft-tissue]
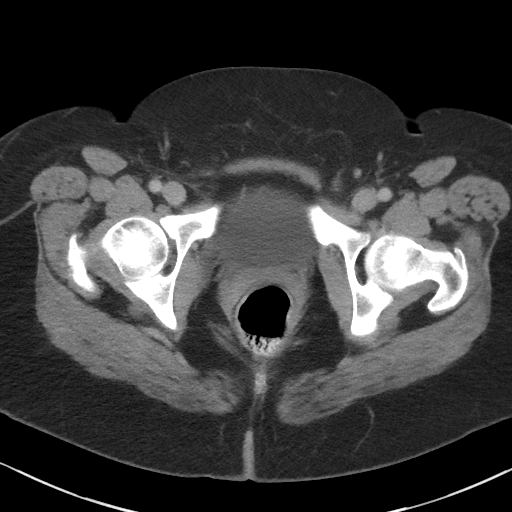
[im 18/88  soft-tissue]
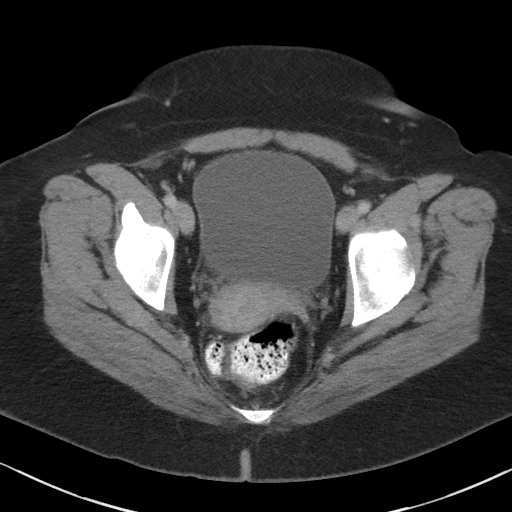
[im 24/88  soft-tissue]
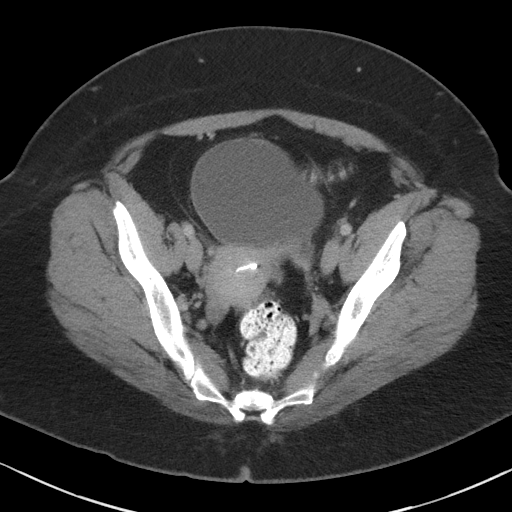
[im 30/88  soft-tissue]
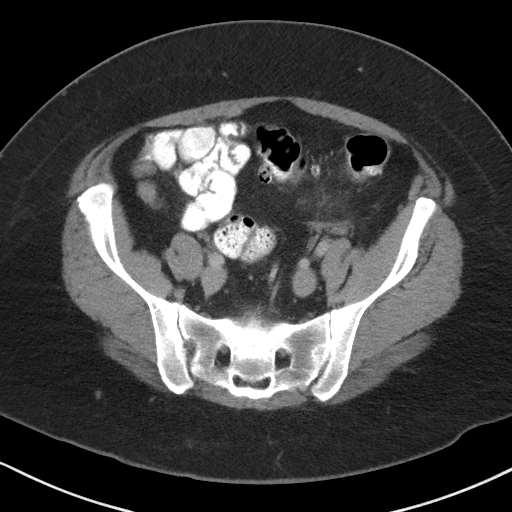
[im 35/88  soft-tissue]
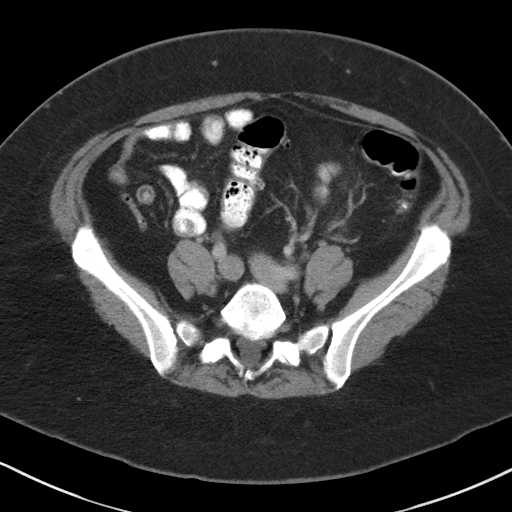
[im 47/88  soft-tissue]
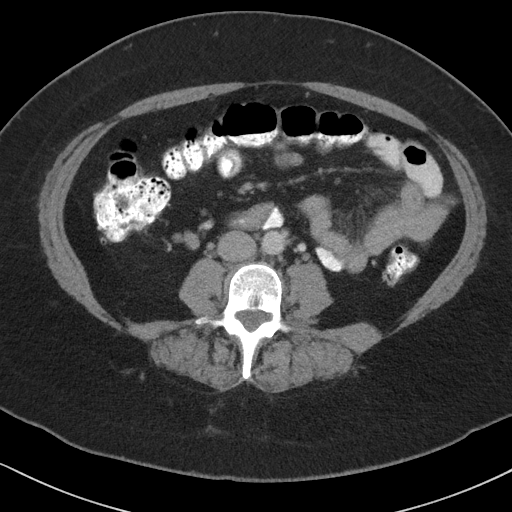
[im 53/88  soft-tissue]
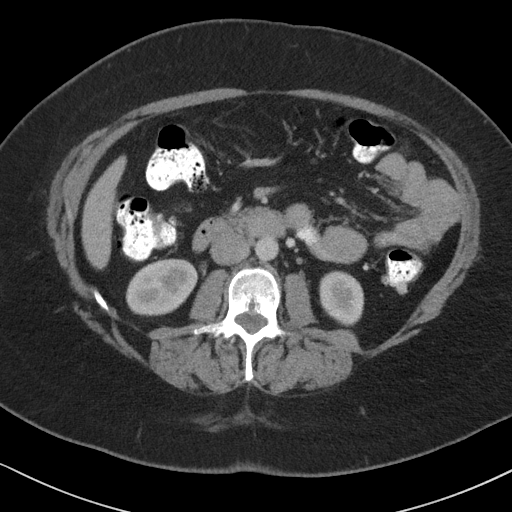
[im 59/88  soft-tissue]
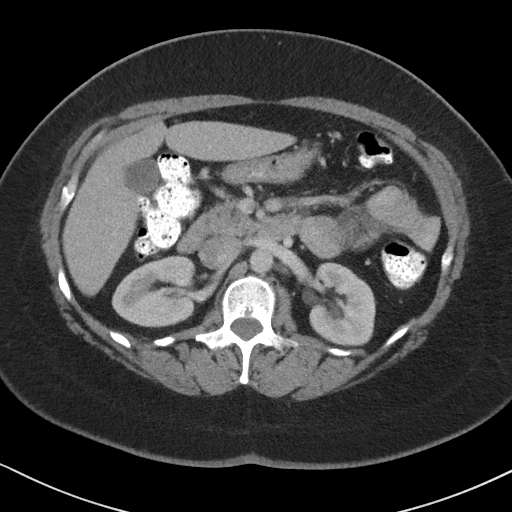
[im 59/88  bone]
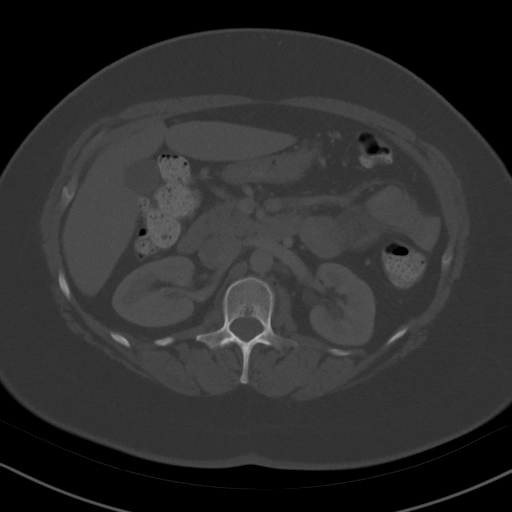
[im 64/88  soft-tissue]
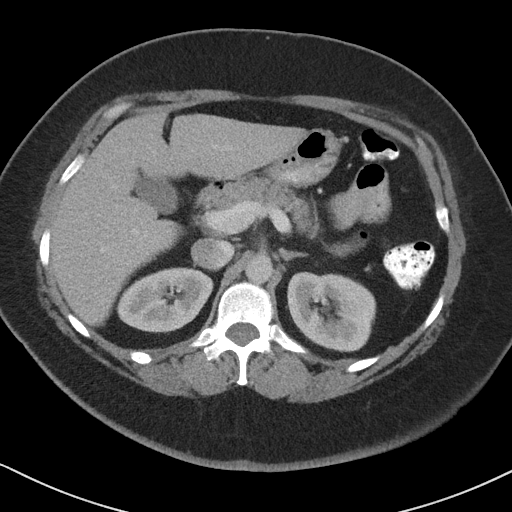
[im 70/88  soft-tissue]
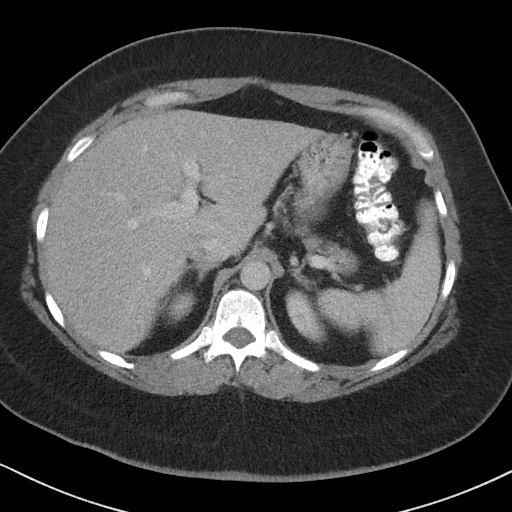
[im 76/88  soft-tissue]
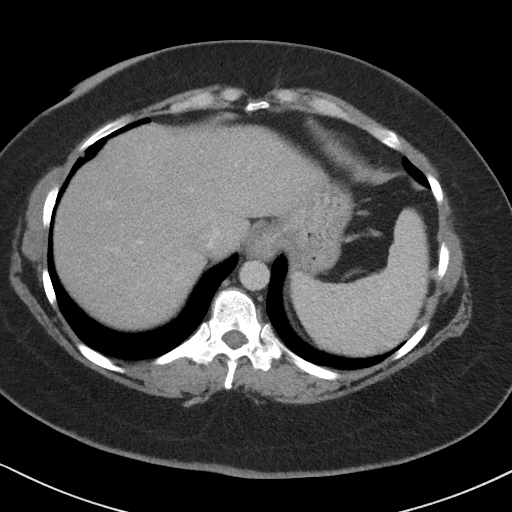
[im 82/88  soft-tissue]
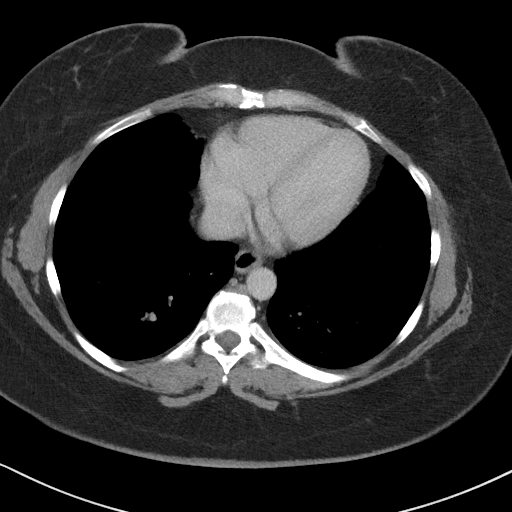

[Series 5: coronal st · coronal · 0.72mm/px · 3 of 102 slices shown]
[im 34/102  soft-tissue]
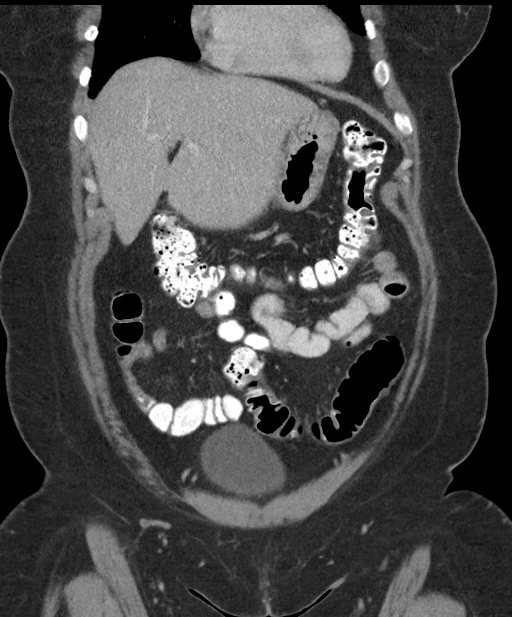
[im 45/102  soft-tissue]
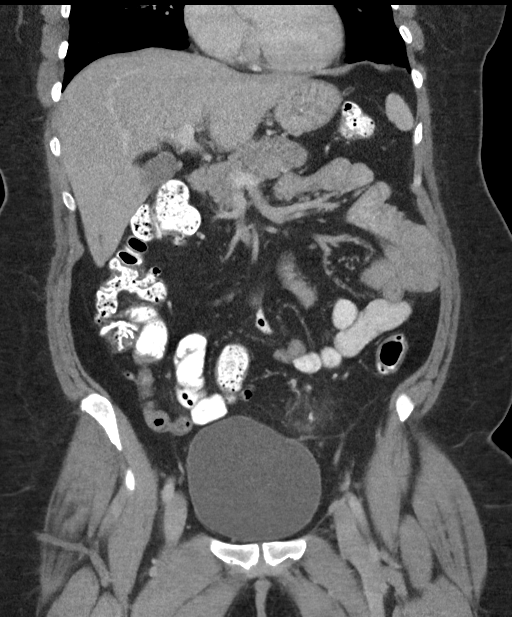
[im 57/102  soft-tissue]
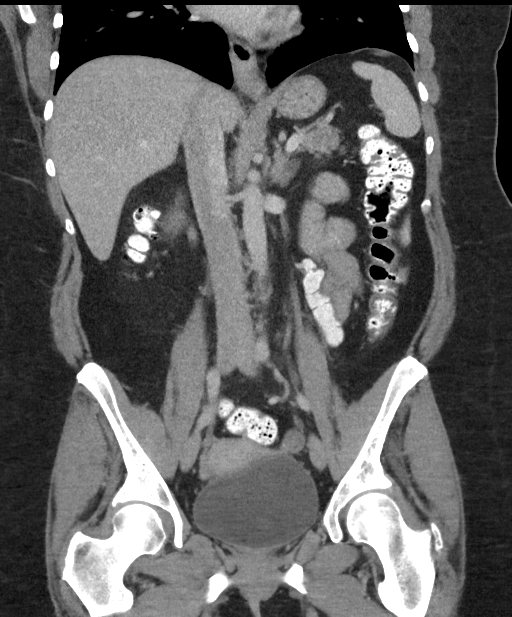

[16 of 46 positions shown; findings below may reference images not displayed]

FINDINGS: Lower chest: No acute abnormality.

Hepatobiliary: No focal liver abnormality is seen. No gallstones,
gallbladder wall thickening, or biliary dilatation.

Pancreas: Unremarkable. No pancreatic ductal dilatation or
surrounding inflammatory changes.

Spleen: Normal in size without focal abnormality.

Adrenals/Urinary Tract: Adrenal glands are unremarkable. Kidneys are
normal, without renal calculi, focal lesion, or hydronephrosis.
Bladder is unremarkable

Stomach/Bowel: Diffuse colonic diverticulosis. Focal wall thickening
of the proximal sigmoid colon adjacent to an inflamed diverticulum
(series 2, image 61). Tiny focus of extraluminal air (series 2,
image 60). No fluid collection. The stomach and small bowel are
unremarkable. Normal appendix.

Vascular/Lymphatic: No significant vascular findings are present. No
enlarged abdominal or pelvic lymph nodes.

Reproductive: IUD appropriately positioned within the uterus. No
adnexal mass.

Other: No free fluid or pneumoperitoneum.

Musculoskeletal: No acute or significant osseous findings.
IMPRESSION: 1. Acute sigmoid diverticulitis with microperforation.  No abscess.

These results will be called to the ordering clinician or
representative by the Radiologist Assistant, and communication
documented in the PACS or [REDACTED].

## 2022-10-14 ENCOUNTER — Encounter (HOSPITAL_COMMUNITY): Payer: Self-pay

## 2022-10-14 ENCOUNTER — Ambulatory Visit (HOSPITAL_COMMUNITY)
Admission: EM | Admit: 2022-10-14 | Discharge: 2022-10-14 | Disposition: A | Discharge status: Home / Self Care | Payer: BC Managed Care – PPO | Attending: Family Medicine | Admitting: Family Medicine

## 2022-10-14 DIAGNOSIS — M545 Low back pain, unspecified: Secondary | ICD-10-CM | POA: Diagnosis not present

## 2022-10-14 MED ORDER — PREDNISONE 20 MG PO TABS: 40.0000 mg | ORAL_TABLET | Freq: Every day | ORAL | 0 refills | Status: AC

## 2022-10-14 MED ORDER — TRAMADOL HCL 50 MG PO TABS: 50.0000 mg | ORAL_TABLET | Freq: Four times a day (QID) | ORAL | 0 refills | Status: DC | PRN

## 2022-10-14 MED ORDER — TIZANIDINE HCL 4 MG PO TABS: 4.0000 mg | ORAL_TABLET | Freq: Three times a day (TID) | ORAL | 0 refills | Status: DC | PRN

## 2022-10-14 MED ORDER — KETOROLAC TROMETHAMINE 30 MG/ML IJ SOLN
30.0000 mg | Freq: Once | INTRAMUSCULAR | Status: AC
  Administered 2022-10-14: 30 mg via INTRAMUSCULAR

## 2022-10-14 MED ORDER — KETOROLAC TROMETHAMINE 30 MG/ML IJ SOLN
INTRAMUSCULAR | Status: AC
  Filled 2022-10-14: qty 1

## 2022-10-14 NOTE — Discharge Instructions (Signed)
You have been given a shot of Toradol 30 mg today.  Take prednisone 20 mg--2 daily for 5 days   Take tizanidine 4 mg--1 every 8 hours as needed for muscle spasms; this often causes sedation or dizziness  Take tramadol 50 mg-- 1 tablet every 6 hours as needed for pain.  This medication can make you sleepy or dizzy

## 2022-10-14 NOTE — ED Triage Notes (Signed)
Pt reports lower back pain x 2 days. Pt reports lifting 50 pound water jug and heard something pop.

## 2022-10-14 NOTE — ED Provider Notes (Signed)
[facility]    CSN: [id number] Arrival date & time: 10/14/22  [place]      History   Chief Complaint Chief Complaint  Patient presents with   Back Pain    HPI Samantha Delgado is a 46 y.o. female.    Back Pain  Here for low back pain.  On November 20 she lifted a 5 gallon container of water and thought that maybe she had twisted "wrong" while lifting.  She did not have a pop or pain when that was done.  Then on November 22 she started feeling her low back tighten up across her low back.  It is worsened and now hurts to bend and twist her torso.  No fever and no rash.  She states she has been told that since she has diverticulosis, she should not take NSAIDs. Past Medical History:  Diagnosis Date   Diverticulitis    CT scan   Diverticulitis    GERD (gastroesophageal reflux disease)    Heart murmur    Obesity     There are no problems to display for this patient.   Past Surgical History:  Procedure Laterality Date   TONSILLECTOMY      OB History   No obstetric history on file.      Home Medications    Prior to Admission medications   Medication Sig Start Date End Date Taking? Authorizing Provider  predniSONE (DELTASONE) 20 MG tablet Take 2 tablets (40 mg total) by mouth daily with breakfast for 5 days. 10/14/22 10/19/22 Yes Les Longmore, Gwenlyn Perking, MD  tiZANidine (ZANAFLEX) 4 MG tablet Take 1 tablet (4 mg total) by mouth every 8 (eight) hours as needed for muscle spasms. 10/14/22  Yes Barrett Henle, MD  traMADol (ULTRAM) 50 MG tablet Take 1 tablet (50 mg total) by mouth every 6 (six) hours as needed (pain). 10/14/22  Yes Barrett Henle, MD  dicyclomine (BENTYL) 10 MG capsule Take 1 capsule (10 mg total) by mouth 4 (four) times daily as needed for spasms. 03/22/22   Thornton Park, MD  simethicone (GAS-X) 80 MG chewable tablet Chew 1 tablet (80 mg total) by mouth every 6 (six) hours as needed for flatulence. 03/22/22   Thornton Park, MD     Family History Family History  Problem Relation Age of Onset   Clotting disorder Mother        blood clot after surgery   Bladder Cancer Father    Diabetes Father    Hypertension Father    Hyperlipidemia Father    Heart Problems Father    Diabetes Sister    Ovarian cancer Maternal Grandmother    Multiple myeloma Maternal Grandfather    Diabetes Paternal Grandmother    Heart failure Paternal Grandmother    Colon cancer Neg Hx    Colon polyps Neg Hx     Social History Social History   Tobacco Use   Smoking status: Former    Years: 4.00    Types: Cigarettes    Quit date: 1997    Years since quitting: 26.9   Smokeless tobacco: Never  Vaping Use   Vaping Use: Never used  Substance Use Topics   Alcohol use: Yes    Comment: occasional beer   Drug use: Not Currently     Allergies   Patient has no known allergies.   Review of Systems Review of Systems  Musculoskeletal:  Positive for back pain.     Physical Exam Triage Vital Signs ED Triage Vitals  Enc Vitals Group     BP 10/14/22 1905 (!) 152/69     Pulse Rate 10/14/22 1905 72     Resp 10/14/22 1905 18     Temp 10/14/22 1905 98 F (36.7 C)     Temp Source 10/14/22 1905 Oral     SpO2 10/14/22 1905 98 %     Weight --      Height --      Head Circumference --      Peak Flow --      Pain Score 10/14/22 1910 8     Pain Loc --      Pain Edu? --      Excl. in [place]? --    No data found.  Updated Vital Signs BP (!) 152/69 (BP Location: Left Arm)   Pulse 92   Temp 98.8 F (37.1 C) (Oral)   Resp 18   LMP 10/05/2022   SpO2 100%   Visual Acuity Right Eye Distance:   Left Eye Distance:   Bilateral Distance:    Right Eye Near:   Left Eye Near:    Bilateral Near:     Physical Exam Vitals reviewed.  Constitutional:      General: She is not in acute distress.    Appearance: She is not ill-appearing, toxic-appearing or diaphoretic.  HENT:     Mouth/Throat:     Mouth: Mucous membranes are moist.   Cardiovascular:     Rate and Rhythm: Normal rate and regular rhythm.     Heart sounds: No murmur heard. Pulmonary:     Effort: Pulmonary effort is normal.     Breath sounds: Normal breath sounds.  Musculoskeletal:     Comments: There is tenderness bilaterally of the paraspinous muscles of the upper lumbar region.  No rash and no erythema or induration  Skin:    Coloration: Skin is not jaundiced or pale.  Neurological:     General: No focal deficit present.     Mental Status: She is alert and oriented to person, place, and time.  Psychiatric:        Behavior: Behavior normal.      UC Treatments / Results  Labs (all labs ordered are listed, but only abnormal results are displayed) Labs Reviewed - No data to display  EKG   Radiology No results found.  Procedures Procedures (including critical care time)  Medications Ordered in UC Medications  ketorolac (TORADOL) 30 MG/ML injection 30 mg (has no administration in time range)    Initial Impression / Assessment and Plan / UC Course  I have reviewed the triage vital signs and the nursing notes.  Pertinent labs & imaging results that were available during my care of the patient were reviewed by me and considered in my medical decision making (see chart for details).        Shot of Toradol is given today as I do not think that we will irritate any diverticulitis.  She had asked about steroids, so 5 days of prednisone is sent, and she is sent in prescriptions for tramadol and a muscle relaxer.  I have warned her about possible sedation or dizziness with either medication Final Clinical Impressions(s) / UC Diagnoses   Final diagnoses:  Acute bilateral low back pain without sciatica     Discharge Instructions      You have been given a shot of Toradol 30 mg today.  Take prednisone 20 mg--2 daily for 5 days   Take tizanidine 4 mg--1  every 8 hours as needed for muscle spasms; this often causes sedation or  dizziness  Take tramadol 50 mg-- 1 tablet every 6 hours as needed for pain.  This medication can make you sleepy or dizzy       ED Prescriptions     Medication Sig Dispense Auth. Provider   predniSONE (DELTASONE) 20 MG tablet Take 2 tablets (40 mg total) by mouth daily with breakfast for 5 days. 10 tablet Barrett Henle, MD   traMADol (ULTRAM) 50 MG tablet Take 1 tablet (50 mg total) by mouth every 6 (six) hours as needed (pain). 12 tablet Clarine Elrod, Gwenlyn Perking, MD   tiZANidine (ZANAFLEX) 4 MG tablet Take 1 tablet (4 mg total) by mouth every 8 (eight) hours as needed for muscle spasms. 30 tablet Katelyne Galster, Gwenlyn Perking, MD      I have reviewed the PDMP during this encounter.   Barrett Henle, MD 10/14/22 [phone]

## 2022-11-03 ENCOUNTER — Encounter: Payer: Self-pay | Admitting: *Deleted

## 2022-11-22 ENCOUNTER — Telehealth: Payer: Self-pay | Admitting: Physician Assistant

## 2022-11-22 NOTE — Telephone Encounter (Signed)
Pt called asking if she could transfer care to [facility]? Pt stated the reason she was attending [place] was due to her daughter attending a nearby school there & she is now attending a different school closer to where they live in [place]. Call back # [phone]

## 2022-12-12 DIAGNOSIS — M9903 Segmental and somatic dysfunction of lumbar region: Secondary | ICD-10-CM | POA: Diagnosis not present

## 2022-12-12 DIAGNOSIS — M9902 Segmental and somatic dysfunction of thoracic region: Secondary | ICD-10-CM | POA: Diagnosis not present

## 2022-12-12 DIAGNOSIS — M9904 Segmental and somatic dysfunction of sacral region: Secondary | ICD-10-CM | POA: Diagnosis not present

## 2022-12-12 DIAGNOSIS — M9901 Segmental and somatic dysfunction of cervical region: Secondary | ICD-10-CM | POA: Diagnosis not present

## 2022-12-14 DIAGNOSIS — M9902 Segmental and somatic dysfunction of thoracic region: Secondary | ICD-10-CM | POA: Diagnosis not present

## 2022-12-14 DIAGNOSIS — M62830 Muscle spasm of back: Secondary | ICD-10-CM | POA: Diagnosis not present

## 2022-12-14 DIAGNOSIS — M9904 Segmental and somatic dysfunction of sacral region: Secondary | ICD-10-CM | POA: Diagnosis not present

## 2022-12-14 DIAGNOSIS — M9901 Segmental and somatic dysfunction of cervical region: Secondary | ICD-10-CM | POA: Diagnosis not present

## 2022-12-14 DIAGNOSIS — M9903 Segmental and somatic dysfunction of lumbar region: Secondary | ICD-10-CM | POA: Diagnosis not present

## 2022-12-16 DIAGNOSIS — M62830 Muscle spasm of back: Secondary | ICD-10-CM | POA: Diagnosis not present

## 2022-12-16 DIAGNOSIS — M9902 Segmental and somatic dysfunction of thoracic region: Secondary | ICD-10-CM | POA: Diagnosis not present

## 2022-12-16 DIAGNOSIS — M9903 Segmental and somatic dysfunction of lumbar region: Secondary | ICD-10-CM | POA: Diagnosis not present

## 2022-12-16 DIAGNOSIS — M9901 Segmental and somatic dysfunction of cervical region: Secondary | ICD-10-CM | POA: Diagnosis not present

## 2022-12-16 DIAGNOSIS — M9904 Segmental and somatic dysfunction of sacral region: Secondary | ICD-10-CM | POA: Diagnosis not present

## 2022-12-21 DIAGNOSIS — M9904 Segmental and somatic dysfunction of sacral region: Secondary | ICD-10-CM | POA: Diagnosis not present

## 2022-12-21 DIAGNOSIS — M9903 Segmental and somatic dysfunction of lumbar region: Secondary | ICD-10-CM | POA: Diagnosis not present

## 2022-12-21 DIAGNOSIS — M9902 Segmental and somatic dysfunction of thoracic region: Secondary | ICD-10-CM | POA: Diagnosis not present

## 2022-12-21 DIAGNOSIS — M62830 Muscle spasm of back: Secondary | ICD-10-CM | POA: Diagnosis not present

## 2022-12-21 DIAGNOSIS — M9901 Segmental and somatic dysfunction of cervical region: Secondary | ICD-10-CM | POA: Diagnosis not present

## 2023-01-23 ENCOUNTER — Encounter: Payer: BC Managed Care – PPO | Admitting: Physician Assistant

## 2023-02-13 ENCOUNTER — Encounter: Payer: BC Managed Care – PPO | Admitting: Family

## 2023-12-25 ENCOUNTER — Ambulatory Visit: Payer: BC Managed Care – PPO | Admitting: Family

## 2023-12-25 ENCOUNTER — Encounter: Payer: Self-pay | Admitting: Family

## 2023-12-25 ENCOUNTER — Telehealth: Payer: Self-pay

## 2023-12-25 VITALS — BP 136/80 | HR 67 | Temp 98.0°F | Ht 59.0 in | Wt 222.4 lb

## 2023-12-25 DIAGNOSIS — Z862 Personal history of diseases of the blood and blood-forming organs and certain disorders involving the immune mechanism: Secondary | ICD-10-CM | POA: Diagnosis not present

## 2023-12-25 DIAGNOSIS — R7303 Prediabetes: Secondary | ICD-10-CM | POA: Diagnosis not present

## 2023-12-25 DIAGNOSIS — Z808 Family history of malignant neoplasm of other organs or systems: Secondary | ICD-10-CM | POA: Diagnosis not present

## 2023-12-25 DIAGNOSIS — E782 Mixed hyperlipidemia: Secondary | ICD-10-CM | POA: Diagnosis not present

## 2023-12-25 DIAGNOSIS — R011 Cardiac murmur, unspecified: Secondary | ICD-10-CM | POA: Insufficient documentation

## 2023-12-25 DIAGNOSIS — Z6841 Body Mass Index (BMI) 40.0 and over, adult: Secondary | ICD-10-CM

## 2023-12-25 DIAGNOSIS — K2100 Gastro-esophageal reflux disease with esophagitis, without bleeding: Secondary | ICD-10-CM | POA: Insufficient documentation

## 2023-12-25 DIAGNOSIS — Z8719 Personal history of other diseases of the digestive system: Secondary | ICD-10-CM

## 2023-12-25 LAB — CBC
HCT: 40.1 % (ref 36.0–46.0)
Hemoglobin: 13.1 g/dL (ref 12.0–15.0)
MCHC: 32.6 g/dL (ref 30.0–36.0)
MCV: 83.3 fL (ref 78.0–100.0)
Platelets: 370 10*3/uL (ref 150.0–400.0)
RBC: 4.81 Mil/uL (ref 3.87–5.11)
RDW: 15.8 % — ABNORMAL HIGH (ref 11.5–15.5)
WBC: 8.4 10*3/uL (ref 4.0–10.5)

## 2023-12-25 LAB — TSH: TSH: 2.56 u[IU]/mL (ref 0.35–5.50)

## 2023-12-25 LAB — COMPREHENSIVE METABOLIC PANEL
ALT: 17 U/L (ref 0–35)
AST: 20 U/L (ref 0–37)
Albumin: 4.3 g/dL (ref 3.5–5.2)
Alkaline Phosphatase: 41 U/L (ref 39–117)
BUN: 10 mg/dL (ref 6–23)
CO2: 26 meq/L (ref 19–32)
Calcium: 9.3 mg/dL (ref 8.4–10.5)
Chloride: 103 meq/L (ref 96–112)
Creatinine, Ser: 0.72 mg/dL (ref 0.40–1.20)
GFR: 99.85 mL/min (ref >60.00)
Glucose, Bld: 85 mg/dL (ref 70–99)
Potassium: 4.2 meq/L (ref 3.5–5.1)
Sodium: 137 meq/L (ref 135–145)
Total Bilirubin: 0.4 mg/dL (ref 0.2–1.2)
Total Protein: 7.3 g/dL (ref 6.0–8.3)

## 2023-12-25 LAB — LIPID PANEL
Cholesterol: 170 mg/dL (ref 0–200)
HDL: 65.9 mg/dL (ref >39.00)
LDL Cholesterol: 91 mg/dL (ref 0–99)
NonHDL: 103.82
Total CHOL/HDL Ratio: 3
Triglycerides: 62 mg/dL (ref 0.0–149.0)
VLDL: 12.4 mg/dL (ref 0.0–40.0)

## 2023-12-25 LAB — HEMOGLOBIN A1C: Hgb A1c MFr Bld: 5.6 % (ref 4.6–6.5)

## 2023-12-25 MED ORDER — ZEPBOUND 5 MG/0.5ML ~~LOC~~ SOAJ: 5.0000 mg | SUBCUTANEOUS | 0 refills | Status: DC

## 2023-12-25 MED ORDER — ZEPBOUND 7.5 MG/0.5ML ~~LOC~~ SOAJ: 7.5000 mg | SUBCUTANEOUS | 0 refills | Status: DC

## 2023-12-25 MED ORDER — ZEPBOUND 2.5 MG/0.5ML ~~LOC~~ SOAJ: 2.5000 mg | SUBCUTANEOUS | 0 refills | Status: DC

## 2023-12-25 NOTE — Assessment & Plan Note (Signed)
 Ordered lipid panel, pending results. Work on low cholesterol diet and exercise as tolerated

## 2023-12-25 NOTE — Progress Notes (Signed)
Established Patient Office Visit  Subjective:  Patient ID: Samantha Delgado, female    DOB: [birth year]  Age: 48 y.o. MRN: [id number]  CC:  Chief Complaint  Patient presents with   Establish Care    HPI Samantha Delgado is here for a transition of care visit.  Prior provider was: Alyssa Allwardt  Pt is with acute concerns.  [facility]   States often with heartburn 1-2 tums a week Tries to not eat spicy but there are foods where she works that she does eat and this exacerbates symptoms.   Heart murmur, intermittent. States has been since childhood. Denies sob and or chest pain or palpitations.  chronic concerns:  Obesity, stress and boredom eating. She is curious if she could try a GLP 1. She denies family or personal hx thyroid cancer or neoendocrine dysplasia. She wants to work on an exercise regimen, she states she doesn't like to exercise at her current weight. She went vegan last year 2024 for six months and lost 10 pounds but then yo yo back and gained her weight back after going on vacation, and then after that she gained about 30 pounds back She likes to hike, [place]    Past Medical History:  Diagnosis Date   Diverticulitis    CT scan   GERD (gastroesophageal reflux disease)    Heart murmur    Obesity     Past Surgical History:  Procedure Laterality Date   TONSILLECTOMY      Family History  Problem Relation Age of Onset   Deep vein thrombosis Mother        blood clot after surgery   Bladder Cancer Father    Diabetes Father    Hypertension Father    Hyperlipidemia Father    Heart Problems Father    Diabetes Sister    Ovarian cancer Maternal Grandmother 87   Multiple myeloma Maternal Grandfather    Diabetes Paternal Grandmother    Heart failure Paternal Grandmother    Colon cancer Neg Hx    Colon polyps Neg Hx     Social History   Socioeconomic History   Marital status: Significant Other    Spouse name:  Not on file   Number of children: 1   Years of education: Not on file   Highest education level: Not on file  Occupational History   Occupation: Programmer, multimedia    Comment: Gaffer  Tobacco Use   Smoking status: Former    Current packs/day: 0.00    Types: Cigarettes    Start date: 1993    Quit date: 1997    Years since quitting: 28.1   Smokeless tobacco: Never  Vaping Use   Vaping status: Never Used  Substance and Sexual Activity   Alcohol use: Yes    Comment: occasional beer   Drug use: Never   Sexual activity: Yes    Partners: Male    Birth control/protection: I.U.D.  Other Topics Concern   Not on file  Social History Narrative   Daughter zoe langley   Social Drivers of Health   Financial Resource Strain: Not on file  Food Insecurity: Not on file  Transportation Needs: Not on file  Physical Activity: Not on file  Stress: Not on file  Social Connections: Not on file  Intimate Partner Violence: Not on file    Outpatient Medications Prior to Visit  Medication Sig Dispense Refill   levonorgestrel (MIRENA) 20 MCG/DAY IUD 1 each  by Intrauterine route once.     dicyclomine (BENTYL) 10 MG capsule Take 1 capsule (10 mg total) by mouth 4 (four) times daily as needed for spasms. 30 capsule 1   simethicone (GAS-X) 80 MG chewable tablet Chew 1 tablet (80 mg total) by mouth every 6 (six) hours as needed for flatulence. 30 tablet 0   tiZANidine (ZANAFLEX) 4 MG tablet Take 1 tablet (4 mg total) by mouth every 8 (eight) hours as needed for muscle spasms. 30 tablet 0   traMADol (ULTRAM) 50 MG tablet Take 1 tablet (50 mg total) by mouth every 6 (six) hours as needed (pain). 12 tablet 0   No facility-administered medications prior to visit.    No Known Allergies  ROS: Pertinent symptoms negative unless otherwise noted in HPI      Objective:    Physical Exam Vitals reviewed.  Constitutional:      Appearance: Normal appearance. She is obese.  Eyes:      General:        Right eye: No discharge.        Left eye: No discharge.     Conjunctiva/sclera: Conjunctivae normal.  Cardiovascular:     Rate and Rhythm: Normal rate and regular rhythm.     Heart sounds: Murmur (about every 7th beat) heard.  Pulmonary:     Effort: Pulmonary effort is normal. No respiratory distress.     Breath sounds: Normal breath sounds.  Musculoskeletal:        General: Normal range of motion.     Cervical back: Normal range of motion.  Neurological:     General: No focal deficit present.     Mental Status: She is alert and oriented to person, place, and time. Mental status is at baseline.  Psychiatric:        Mood and Affect: Mood normal.        Behavior: Behavior normal.        Thought Content: Thought content normal.        Judgment: Judgment normal.       BP 136/80 (BP Location: Left Arm, Patient Position: Sitting, Cuff Size: Large)   Pulse 67   Temp 98 F (36.7 C) (Temporal)   Ht 4\' 11"  (1.499 m)   Wt 222 lb 6.4 oz (100.9 kg)   LMP 12/21/2023 (Exact Date)   SpO2 99%   BMI 44.92 kg/m  Wt Readings from Last 3 Encounters:  12/25/23 222 lb 6.4 oz (100.9 kg)  03/23/22 204 lb 6.4 oz (92.7 kg)  03/21/22 207 lb (93.9 kg)     Health Maintenance Due  Topic Date Due   Cervical Cancer Screening (HPV/Pap Cotest)  Never done   COVID-19 Vaccine (2 - Moderna risk series) 09/20/2023    There are no preventive care reminders to display for this patient.  No results found for: "TSH" Lab Results  Component Value Date   WBC 11.0 (H) 03/23/2022   HGB 12.2 03/23/2022   HCT 37.3 03/23/2022   MCV 82.8 03/23/2022   PLT 302.0 03/23/2022   Lab Results  Component Value Date   NA 134 (L) 08/13/2021   K 3.5 08/13/2021   CO2 24 08/13/2021   GLUCOSE 96 08/13/2021   BUN 7 08/13/2021   CREATININE 0.80 08/13/2021   BILITOT 1.1 08/13/2021   ALKPHOS 31 (L) 08/13/2021   AST 22 08/13/2021   ALT 24 08/13/2021   PROT 7.5 08/13/2021   ALBUMIN 3.5 08/13/2021    CALCIUM 9.0 08/13/2021  ANIONGAP 8 08/13/2021   No results found for: "CHOL" No results found for: "HDL" No results found for: "LDLCALC" No results found for: "TRIG" No results found for: "CHOLHDL" No results found for: "HGBA1C"    Assessment & Plan:   Family history of malignant melanoma  Prediabetes Assessment & Plan: Pt advised of the following: Work on a diabetic diet, try to incorporate exercise at least 20-30 a day for 3 days a week or more.  Ordering A1c pending results   Orders: -     Hemoglobin A1c -     Comprehensive metabolic panel -     Zepbound; Inject 5 mg into the skin once a week. After four weeks of 2.5 mg weekly increase to 5 mg once weekly  Dispense: 2 mL; Refill: 0 -     Zepbound; Inject 2.5 mg into the skin once a week. Once complete with four weeks of weekly injection will increase to 5 mg Qweek with new RX  Dispense: 2 mL; Refill: 0 -     Zepbound; Inject 7.5 mg into the skin once a week.  Dispense: 6 mL; Refill: 0  Morbid obesity (HCC) Assessment & Plan: Pt advised to work on diet and exercise as tolerated Trial GLP 1  The beneficiary does not have any FDA labeled contraindications to the requested agent including pregnancy, lactation, h/o medullary thyroid cancer or multiple endocrine neoplasia type II.    Orders: -     TSH -     Zepbound; Inject 5 mg into the skin once a week. After four weeks of 2.5 mg weekly increase to 5 mg once weekly  Dispense: 2 mL; Refill: 0 -     Zepbound; Inject 2.5 mg into the skin once a week. Once complete with four weeks of weekly injection will increase to 5 mg Qweek with new RX  Dispense: 2 mL; Refill: 0 -     Zepbound; Inject 7.5 mg into the skin once a week.  Dispense: 6 mL; Refill: 0  Mixed hyperlipidemia Assessment & Plan: Ordered lipid panel, pending results. Work on low cholesterol diet and exercise as tolerated   Orders: -     Lipid panel -     Zepbound; Inject 5 mg into the skin once a week. After four  weeks of 2.5 mg weekly increase to 5 mg once weekly  Dispense: 2 mL; Refill: 0 -     Zepbound; Inject 2.5 mg into the skin once a week. Once complete with four weeks of weekly injection will increase to 5 mg Qweek with new RX  Dispense: 2 mL; Refill: 0 -     Zepbound; Inject 7.5 mg into the skin once a week.  Dispense: 6 mL; Refill: 0  History of anemia -     CBC  History of diverticulitis  Gastroesophageal reflux disease with esophagitis without hemorrhage Assessment & Plan: Pt states manageable for now Tums prn  Can consider 2 week ppi if needed in future Try to decrease and or avoid spicy foods, fried fatty foods, and also caffeine and chocolate as these can increase heartburn symptoms.     Heart murmur    Meds ordered this encounter  Medications   tirzepatide (ZEPBOUND) 5 MG/0.5ML Pen    Sig: Inject 5 mg into the skin once a week. After four weeks of 2.5 mg weekly increase to 5 mg once weekly    Dispense:  2 mL    Refill:  0    Supervising Provider:  BEDSOLE, AMY E [[id number]]   tirzepatide (ZEPBOUND) 2.5 MG/0.5ML Pen    Sig: Inject 2.5 mg into the skin once a week. Once complete with four weeks of weekly injection will increase to 5 mg Qweek with new RX    Dispense:  2 mL    Refill:  0    Supervising Provider:   BEDSOLE, AMY E [[id number]]   tirzepatide (ZEPBOUND) 7.5 MG/0.5ML Pen    Sig: Inject 7.5 mg into the skin once a week.    Dispense:  6 mL    Refill:  0    Supervising Provider:   Ermalene Searing, AMY E [[id number]]    Follow-up: Return in about 3 months (around 03/23/2024) for f/u weight loss medication.    Mort Sawyers, FNP

## 2023-12-25 NOTE — Assessment & Plan Note (Signed)
Pt advised of the following: Work on a diabetic diet, try to incorporate exercise at least 20-30 a day for 3 days a week or more.  Ordering A1c pending results. 

## 2023-12-25 NOTE — Assessment & Plan Note (Signed)
Pt advised to work on diet and exercise as tolerated Trial GLP 1  The beneficiary does not have any FDA labeled contraindications to the requested agent including pregnancy, lactation, h/o medullary thyroid cancer or multiple endocrine neoplasia type II.

## 2023-12-25 NOTE — Telephone Encounter (Signed)
Copied from CRM [id number]. Topic: Clinical - Prescription Issue >> Dec 25, 2023 11:02 AM Fredrich Romans wrote: Reason for CRM: Patient needs a PA for tirzepatide (ZEPBOUND) 2.5 MG/0.5ML Pen

## 2023-12-25 NOTE — Assessment & Plan Note (Signed)
Pt states manageable for now Tums prn  Can consider 2 week ppi if needed in future Try to decrease and or avoid spicy foods, fried fatty foods, and also caffeine and chocolate as these can increase heartburn symptoms.

## 2023-12-26 ENCOUNTER — Encounter: Payer: Self-pay | Admitting: Family

## 2023-12-27 ENCOUNTER — Other Ambulatory Visit (HOSPITAL_COMMUNITY): Payer: Self-pay

## 2023-12-27 ENCOUNTER — Telehealth: Payer: Self-pay

## 2023-12-27 NOTE — Telephone Encounter (Signed)
 Pharmacy Patient Advocate Encounter   Received notification from  [facility] Portal that prior authorization for Zepbound  2.5MG /0.5ML pen-injectors is required/requested.   Insurance verification completed.   The patient is insured through [employer] .   Per test claim: PA required; PA submitted to above mentioned insurance via CoverMyMeds Key/confirmation #/EOC [facility] Status is pending

## 2023-12-28 ENCOUNTER — Other Ambulatory Visit (HOSPITAL_COMMUNITY): Payer: Self-pay

## 2023-12-28 NOTE — Telephone Encounter (Signed)
 Pharmacy Patient Advocate Encounter  Received notification from [facility] that Prior Authorization for Zepbound  2.5MG /0.5ML pen-injectors has been APPROVED from 12/28/2023 to 07/28/2024. Ran test claim, Copay is $35.00. This test claim was processed through [facility]- copay amounts may vary at other pharmacies due to pharmacy/plan contracts, or as the patient moves through the different stages of their insurance plan.   PA #/Case ID/Reference #: [id number]

## 2024-01-02 ENCOUNTER — Other Ambulatory Visit (HOSPITAL_COMMUNITY): Payer: Self-pay

## 2024-01-02 NOTE — Telephone Encounter (Signed)
PA request has been Approved. New Encounter created for follow up. For additional info see Pharmacy Prior Auth telephone encounter from 12/27/23.

## 2024-01-16 ENCOUNTER — Other Ambulatory Visit (HOSPITAL_COMMUNITY): Payer: Self-pay

## 2024-01-17 ENCOUNTER — Other Ambulatory Visit (HOSPITAL_COMMUNITY): Payer: Self-pay

## 2024-03-25 ENCOUNTER — Encounter: Payer: Self-pay | Admitting: Family

## 2024-03-25 ENCOUNTER — Ambulatory Visit: Payer: BC Managed Care – PPO | Admitting: Family

## 2024-03-25 MED ORDER — ZEPBOUND 10 MG/0.5ML ~~LOC~~ SOAJ: 10.0000 mg | SUBCUTANEOUS | 0 refills | Status: DC

## 2024-03-25 NOTE — Assessment & Plan Note (Signed)
 Continue on zepbound  7.5 mg weekly.  Pt advised to work on diet and exercise as tolerated

## 2024-03-25 NOTE — Patient Instructions (Signed)
  Check out the 'season' app

## 2024-03-25 NOTE — Progress Notes (Signed)
 Established Patient Office Visit  Subjective:      CC:  Chief Complaint  Patient presents with   Medical Management of Chronic Issues    3 month follow up    HPI: Samantha Delgado is a 48 y.o. female presenting on 03/25/2024 for Medical Management of Chronic Issues (3 month follow up)  Obesity, has been on zepbound  currently on 7.5 mg weekly. She was having some constipation but has started a daily fiber with improvement and she has also increased her water intake. She has stopped biting her nails since starting. She has noticed a change in what food she is taking in as she isn't think about 'food all of the time anymore'. She has lost 19 pounds since last visit.   Exercise: she is doing walks trying to do almost daily in between working and what not.     Wt Readings from Last 3 Encounters:  03/25/24 201 lb 6.4 oz (91.4 kg)  12/25/23 222 lb 6.4 oz (100.9 kg)  03/23/22 204 lb 6.4 oz (92.7 kg)        Social history:  Relevant past medical, surgical, family and social history reviewed and updated as indicated. Interim medical history since our last visit reviewed.  Allergies and medications reviewed and updated.  DATA REVIEWED: CHART IN EPIC     ROS: Negative unless specifically indicated above in HPI.    Current Outpatient Medications:    levonorgestrel  (MIRENA ) 20 MCG/DAY IUD, 1 each by Intrauterine route once., Disp: , Rfl:    tirzepatide  (ZEPBOUND ) 10 MG/0.5ML Pen, Inject 10 mg into the skin once a week., Disp: 6 mL, Rfl: 0      Objective:    BP 122/88 (BP Location: Left Arm, Patient Position: Sitting, Cuff Size: Large)   Pulse (!) 58   Temp 98.7 F (37.1 C) (Temporal)   Ht 4\' 11"  (1.499 m)   Wt 201 lb 6.4 oz (91.4 kg)   SpO2 98%   BMI 40.68 kg/m   Wt Readings from Last 3 Encounters:  03/25/24 201 lb 6.4 oz (91.4 kg)  12/25/23 222 lb 6.4 oz (100.9 kg)  03/23/22 204 lb 6.4 oz (92.7 kg)    Physical Exam Constitutional:      General: She is not in  acute distress.    Appearance: Normal appearance. She is obese. She is not ill-appearing, toxic-appearing or diaphoretic.  HENT:     Head: Normocephalic.  Cardiovascular:     Rate and Rhythm: Normal rate and regular rhythm.  Pulmonary:     Effort: Pulmonary effort is normal.  Musculoskeletal:        General: Normal range of motion.  Neurological:     General: No focal deficit present.     Mental Status: She is alert and oriented to person, place, and time. Mental status is at baseline.  Psychiatric:        Mood and Affect: Mood normal.        Behavior: Behavior normal.        Thought Content: Thought content normal.        Judgment: Judgment normal.           Assessment & Plan:  Morbid obesity (HCC) Assessment & Plan: Continue on zepbound  7.5 mg weekly.  Pt advised to work on diet and exercise as tolerated   Orders: -     Zepbound ; Inject 10 mg into the skin once a week.  Dispense: 6 mL; Refill: 0     Return in  about 6 months (around 09/25/2024) for f/u CPE.  Samantha Horns, MSN, APRN, FNP-C [facility]

## 2024-07-03 ENCOUNTER — Other Ambulatory Visit: Payer: Self-pay | Admitting: Family

## 2024-07-05 ENCOUNTER — Encounter: Payer: Self-pay | Admitting: Family

## 2024-07-08 ENCOUNTER — Ambulatory Visit: Payer: Self-pay | Admitting: *Deleted

## 2024-07-08 ENCOUNTER — Other Ambulatory Visit: Payer: Self-pay | Admitting: Family

## 2024-07-08 MED ORDER — ZEPBOUND 10 MG/0.5ML ~~LOC~~ SOAJ: 10.0000 mg | SUBCUTANEOUS | 0 refills | Status: DC

## 2024-07-08 NOTE — Telephone Encounter (Signed)
 NOTED

## 2024-07-08 NOTE — Telephone Encounter (Signed)
 FYI Only or Action Required?: FYI only for provider.  Patient was last seen in primary care on 03/25/2024 by Corwin Antu, FNP.  Called Nurse Triage reporting Ankle Injury.  Symptoms began several days ago.  Interventions attempted: Rest, hydration, or home remedies.  Symptoms are: unchanged.  Triage Disposition: See Physician Within 24 Hours  Patient/caregiver understands and will follow disposition?: yes   Reason for Disposition  [1] MODERATE pain (e.g., interferes with normal activities, limping) AND [2] high-risk adult (e.g., age > 60 years, osteoporosis, chronic steroid use)  Answer Assessment - Initial Assessment Questions 1. MECHANISM: How did the injury happen? (e.g., twisting injury, direct blow)      Tripped- turned ankle 2. ONSET: When did the injury happen? (e.g., minutes or hours ago)      Friday- [employer] telehealth 3. LOCATION: Where is the injury located?      Left- outer side of ankle and base of foot, Top of foot swollen and bruised 4. APPEARANCE of INJURY: What does the injury look like?      Swelling is better, bruising still present 5. WEIGHT-BEARING: Can you put weight on that foot? Can you walk (four steps or more)?       Sensitive to weight- using walker 6. SIZE: For cuts, bruises, or swelling, ask: How large is it? (e.g., inches or centimeters; entire joint)      Bruising, swelling 7. PAIN: Is there pain? If Yes, ask: How bad is the pain?  What does it keep you from doing? (Scale 0-10; or none, mild, moderate, severe)     5/10- trhobbing 8. TETANUS: For any breaks in the skin, ask: When was your last tetanus booster?     na 9. OTHER SYMPTOMS: Do you have any other symptoms?      no  Protocols used: Ankle Injury-A-AH   Copied from CRM [id number]. Topic: Clinical - Red Word Triage >> Jul 08, 2024 10:16 AM Wess RAMAN wrote: Red Word that prompted transfer to Nurse Triage: Clemens on Friday, Twisted ankle. [facility] told her to ice it and  elevate, not improving

## 2024-07-08 NOTE — Telephone Encounter (Signed)
 Copied from CRM #[id number]. Topic: Clinical - Medication Refill >> Jul 08, 2024  8:18 AM Cynthia K wrote: Medication: tirzepatide  (ZEPBOUND ) 10 MG/0.5ML Pen  Has the patient contacted their pharmacy? Yes (Agent: If no, request that the patient contact the pharmacy for the refill. If patient does not wish to contact the pharmacy document the reason why and proceed with request.) (Agent: If yes, when and what did the pharmacy advise?) Pharmacy needs order to refill  This is the patient's preferred pharmacy:  [phone] [facility], [place] - [address] [place] [address] Phone: [phone] Fax: [phone]  [id number] - [place], [place] - [address] [facility] [address] [place] [address] Phone: [phone] Fax: [phone]  Is this the correct pharmacy for this prescription? Yes If no, delete pharmacy and type the correct one.   Has the prescription been filled recently? No  Is the patient out of the medication? Yes  Has the patient been seen for an appointment in the last year OR does the patient have an upcoming appointment? Yes  Can we respond through MyChart? Yes  Agent: Please be advised that Rx refills may take up to 3 business days. We ask that you follow-up with your pharmacy.

## 2024-07-08 NOTE — Telephone Encounter (Signed)
 Appointment with Ginger 07/09/24 at 11:40 am.

## 2024-07-09 ENCOUNTER — Encounter: Payer: Self-pay | Admitting: Family

## 2024-07-09 ENCOUNTER — Ambulatory Visit: Payer: Self-pay | Admitting: Family

## 2024-07-09 ENCOUNTER — Ambulatory Visit
Admission: RE | Admit: 2024-07-09 | Discharge: 2024-07-09 | Disposition: A | Discharge status: Home / Self Care | Source: Ambulatory Visit | Attending: Family

## 2024-07-09 ENCOUNTER — Ambulatory Visit (INDEPENDENT_AMBULATORY_CARE_PROVIDER_SITE_OTHER): Admitting: Family

## 2024-07-09 VITALS — BP 118/84 | HR 68 | Temp 97.8°F | Ht 59.0 in | Wt 186.4 lb

## 2024-07-09 DIAGNOSIS — M2012 Hallux valgus (acquired), left foot: Secondary | ICD-10-CM | POA: Diagnosis not present

## 2024-07-09 DIAGNOSIS — M7732 Calcaneal spur, left foot: Secondary | ICD-10-CM | POA: Diagnosis not present

## 2024-07-09 DIAGNOSIS — Z8719 Personal history of other diseases of the digestive system: Secondary | ICD-10-CM | POA: Diagnosis not present

## 2024-07-09 DIAGNOSIS — S99912A Unspecified injury of left ankle, initial encounter: Secondary | ICD-10-CM

## 2024-07-09 DIAGNOSIS — R7303 Prediabetes: Secondary | ICD-10-CM

## 2024-07-09 DIAGNOSIS — E782 Mixed hyperlipidemia: Secondary | ICD-10-CM | POA: Diagnosis not present

## 2024-07-09 MED ORDER — ZEPBOUND 12.5 MG/0.5ML ~~LOC~~ SOAJ: 12.5000 mg | SUBCUTANEOUS | 0 refills | Status: DC

## 2024-07-09 NOTE — Progress Notes (Signed)
 Established Patient Office Visit  Subjective:      CC:  Chief Complaint  Patient presents with   Ankle Injury    Pt injured L ankle after a fall on 07/05/24.     HPI: Samantha Delgado is a 48 y.o. female presenting on 07/09/2024 for Ankle Injury (Pt injured L ankle after a fall on 07/05/24. ) .  Discussed the use of AI scribe software for clinical note transcription with the patient, who gave verbal consent to proceed.  History of Present Illness Samantha Delgado is a 48 year old female who presents with left ankle pain and swelling following a fall.  She experienced left ankle pain and swelling after a fall on August 15th. The incident occurred when she was getting out of a truck and twisted her ankle on a small crack in the road, landing on her left hip. No current hip pain. Initially, her ankle and toes were very swollen, but the swelling has since decreased. She has been using ice for the first two days and then alternating hot and cold treatments, which she finds helpful.  She is currently using a cane to walk due to difficulty bearing weight on the left foot and reports a constant dull ache in the ankle. The pain was more intense initially, waking her up at night, but has improved slightly. She cannot stand for long periods and describes the foot as 'really sensitive'.  She has not been using any compression or stability devices due to pain when attempting to wear an ankle brace. She has not been taking any pain medication, as the pain is described as a dull ache.  She is currently on Zepbound  at a dose of 10 mg, but she has been unable to refill her prescription due to pharmacy regulations indicating it is too soon to refill. She has been without the medication for almost a week, with her last dose due on the previous Wednesday. She notes that taking the medication can cause migraines, which she manages by timing the dose to coincide with sleep and using Tylenol and hydration to  alleviate symptoms.         Social history:  Relevant past medical, surgical, family and social history reviewed and updated as indicated. Interim medical history since our last visit reviewed.  Allergies and medications reviewed and updated.  DATA REVIEWED: CHART IN EPIC     ROS: Negative unless specifically indicated above in HPI.    Current Outpatient Medications:    levonorgestrel  (MIRENA ) 20 MCG/DAY IUD, 1 each by Intrauterine route once., Disp: , Rfl:    tirzepatide  (ZEPBOUND ) 12.5 MG/0.5ML Pen, Inject 12.5 mg into the skin once a week., Disp: 2 mL, Rfl: 0        Objective:        BP 118/84   Pulse 68   Temp 97.8 F (36.6 C) (Oral)   Ht 4' 11 (1.499 m)   Wt 186 lb 6 oz (84.5 kg)   LMP 07/09/2024   SpO2 99%   BMI 37.64 kg/m   Physical Exam EXTREMITIES: Tenderness at the base of the left ankle, lateral malleolus. Swelling on the top of the left foot. Deviation of the left ankle.  Wt Readings from Last 3 Encounters:  07/09/24 186 lb 6 oz (84.5 kg)  03/25/24 201 lb 6.4 oz (91.4 kg)  12/25/23 222 lb 6.4 oz (100.9 kg)    Physical Exam Vitals reviewed.          Results  Assessment & Plan:   Assessment and Plan Assessment & Plan Left ankle injury Left ankle injury sustained on August 15th due to twisting while stepping on a crack. Swelling, bruising, and tenderness at the base of the left lateral malleolus are present. She has difficulty bearing weight and uses a cane for ambulation. Differential diagnosis includes soft tissue damage versus fracture. Ankle appears abnormal with possible deviation, raising concern for fracture. - Order left ankle x-ray to rule out fracture - Advise use of ACE bandage for compression - Consider referral to orthopedics if fracture is confirmed - Recommend ibuprofen  or naproxen for pain management, with food, due to diverticulitis - Continue icing and alternating hot and cold therapy  Morbid obesity due to  excess calories Currently on Tirzepatide  (Zepbound ) 10 mg/0.5 mL weekly injection. Experiencing refill issues due to pharmacy regulations indicating refill too soon. She has been without medication for almost a week and is due for the next dose. Considering increasing dose to 12.5 mg due to her tolerance and previous experience with migraines as a side effect. She times the dose to minimize migraine impact, taking it before sleep and managing with hydration and Tylenol. - Increase Tirzepatide  (Zepbound ) to 12.5 mg/0.5 mL weekly injection - Monitor for side effects, including migraines, and manage with hydration and Tylenol  Recording duration: 8 minutes      Return if symptoms worsen or fail to improve.     Ginger Patrick, MSN, APRN, FNP-C [facility]

## 2024-07-18 ENCOUNTER — Encounter: Payer: Self-pay | Admitting: Pharmacy Technician

## 2024-07-18 ENCOUNTER — Other Ambulatory Visit (HOSPITAL_COMMUNITY): Payer: Self-pay

## 2024-07-18 ENCOUNTER — Telehealth: Payer: Self-pay | Admitting: Pharmacy Technician

## 2024-07-18 NOTE — Telephone Encounter (Signed)
 Pharmacy Patient Advocate Encounter   Received notification from CoverMyMeds that prior authorization for Zepbound  2.5MG /0.5ML pen-injectors  is due for renewal.   Insurance verification completed.   The patient is insured through [employer]. Key: [id number]  Action: Medication is now available without a prior authorization.  **Previous PA doesn't expired until 07/28/24. Will follow up then for PA.**

## 2024-07-18 NOTE — Telephone Encounter (Signed)
 error

## 2024-07-19 ENCOUNTER — Other Ambulatory Visit (HOSPITAL_COMMUNITY): Payer: Self-pay

## 2024-07-29 ENCOUNTER — Other Ambulatory Visit (HOSPITAL_COMMUNITY): Payer: Self-pay

## 2024-08-16 ENCOUNTER — Encounter: Payer: Self-pay | Admitting: Family

## 2024-08-30 ENCOUNTER — Encounter: Payer: Self-pay | Admitting: Family

## 2024-09-04 ENCOUNTER — Other Ambulatory Visit: Payer: Self-pay | Admitting: Family

## 2024-09-04 DIAGNOSIS — E782 Mixed hyperlipidemia: Secondary | ICD-10-CM

## 2024-09-04 DIAGNOSIS — R7303 Prediabetes: Secondary | ICD-10-CM

## 2024-09-10 ENCOUNTER — Telehealth: Payer: Self-pay | Admitting: Family

## 2024-09-10 NOTE — Telephone Encounter (Signed)
 Called lvm for pt to call back. We are cancelling her lab appt prior to her physical because [facility] does not do orders prior for physicals she will order the lab work at the physical.

## 2024-09-12 ENCOUNTER — Ambulatory Visit: Admitting: Family

## 2024-09-13 ENCOUNTER — Other Ambulatory Visit: Payer: Self-pay | Admitting: Family

## 2024-09-13 DIAGNOSIS — E782 Mixed hyperlipidemia: Secondary | ICD-10-CM

## 2024-09-13 DIAGNOSIS — R7303 Prediabetes: Secondary | ICD-10-CM

## 2024-09-18 ENCOUNTER — Other Ambulatory Visit

## 2024-09-23 ENCOUNTER — Ambulatory Visit: Admitting: Family

## 2024-09-25 ENCOUNTER — Ambulatory Visit: Admitting: Family

## 2024-09-25 VITALS — BP 124/82 | HR 70 | Temp 98.9°F | Ht 59.0 in | Wt 184.0 lb

## 2024-09-25 DIAGNOSIS — R600 Localized edema: Secondary | ICD-10-CM | POA: Diagnosis not present

## 2024-09-25 DIAGNOSIS — Z1231 Encounter for screening mammogram for malignant neoplasm of breast: Secondary | ICD-10-CM | POA: Diagnosis not present

## 2024-09-25 DIAGNOSIS — R7303 Prediabetes: Secondary | ICD-10-CM

## 2024-09-25 DIAGNOSIS — Z0000 Encounter for general adult medical examination without abnormal findings: Secondary | ICD-10-CM | POA: Diagnosis not present

## 2024-09-25 DIAGNOSIS — Z23 Encounter for immunization: Secondary | ICD-10-CM

## 2024-09-25 MED ORDER — HYDROCHLOROTHIAZIDE 12.5 MG PO CAPS: ORAL_CAPSULE | ORAL | 0 refills | Status: AC

## 2024-09-25 MED ORDER — ZEPBOUND 5 MG/0.5ML ~~LOC~~ SOAJ: 5.0000 mg | SUBCUTANEOUS | 1 refills | Status: AC

## 2024-09-25 NOTE — Progress Notes (Signed)
 Subjective:  Patient ID: Samantha Delgado, female    DOB: 31-May-1976  Age: 48 y.o. MRN: [id number]  Patient Care Team: Corwin Antu, FNP as PCP - General (Family Medicine)   CC:  Chief Complaint  Patient presents with   Annual Exam    HPI Samantha Delgado is a 48 y.o. female who presents today for an annual physical exam. She reports consuming a general diet. She really tries to do a brisk walk a few times a week.  She generally feels well. She reports sleeping fairly well. She does have additional problems to discuss today.   Vision:Within last year Dental:Receives regular dental care  Lung Cancer Screening with low-dose Chest CT: quit smoking in 1997 smoked for 28 years   Mammogram: 'probably three years ago' Last pap: 2022 with gyn, due this year she plans to make an appointment Colonoscopy:03/21/22 every ten years   Pt is with acute concerns.   Discussed the use of AI scribe software for clinical note transcription with the patient, who gave verbal consent to proceed.  History of Present Illness Samantha Delgado is a 48 year old female who presents with concerns about medication coverage and stress management.  She is experiencing issues with insurance coverage for her medication, Zepbound , which she has not taken for about three weeks. She was previously on a dose of 12.5 mg and is considering alternative dosing strategies due to the high cost of the medication, which is no longer covered by her insurance.  She reports significant stress due to her husband's health issues and her daughter's upcoming college decisions. She describes herself as a 'stress eater' and is trying to manage her weight, having lost 40-50 pounds. She quit smoking in 1997 after 28 years of smoking.  She experiences swelling in her ankles, which has been present since high school. She has varicose veins but no pain in her calves. She is on her feet a lot due to her job and is considering compression  stockings.  Her sleep is disrupted as her husband frequently wakes her up at night.  She is up to date with her colonoscopy, having had a polyp removed previously, and has received her flu shot. She has not had a mammogram in three years and is due for a Pap smear. She engages in brisk walking for exercise and acknowledges the mental health benefits of physical activity. Gyn, [facility]   Lab Results  Component Value Date   HGBA1C 5.6 12/25/2023   Wt Readings from Last 3 Encounters:  09/25/24 184 lb (83.5 kg)  07/09/24 186 lb 6 oz (84.5 kg)  03/25/24 201 lb 6.4 oz (91.4 kg)      Advanced Directives Patient does not have an advanced directive  DEPRESSION SCREENING    09/25/2024    9:34 AM 07/09/2024   12:02 PM 03/25/2024    9:13 AM 12/25/2023   10:09 AM 07/23/2021    8:15 AM 01/31/2019    1:37 PM  PHQ 2/9 Scores  PHQ - 2 Score 1 0 0 0 0 0  PHQ- 9 Score 2 0 0 1       ROS: Negative unless specifically indicated above in HPI.    Current Outpatient Medications:    hydrochlorothiazide (MICROZIDE) 12.5 MG capsule, Take one po every day prn edema, Disp: 30 capsule, Rfl: 0   tirzepatide  (ZEPBOUND ) 5 MG/0.5ML Pen, Inject 5 mg into the skin once a week., Disp: 6 mL, Rfl: 1   levonorgestrel  (MIRENA ) 20 MCG/DAY  IUD, 1 each by Intrauterine route once., Disp: , Rfl:     Objective:    BP 124/82 (BP Location: Left Arm, Patient Position: Sitting, Cuff Size: Normal)   Pulse 70   Temp 98.9 F (37.2 C) (Temporal)   Ht 4' 11 (1.499 m)   Wt 184 lb (83.5 kg)   SpO2 100%   BMI 37.16 kg/m   BP Readings from Last 3 Encounters:  09/25/24 124/82  07/09/24 118/84  03/25/24 122/88      Physical Exam Constitutional:      General: She is not in acute distress.    Appearance: Normal appearance. She is normal weight. She is not ill-appearing, toxic-appearing or diaphoretic.  HENT:     Head: Normocephalic.     Right Ear: Tympanic membrane normal.     Left Ear: Tympanic membrane  normal.     Nose: Nose normal.     Mouth/Throat:     Mouth: Mucous membranes are moist.  Eyes:     Extraocular Movements: Extraocular movements intact.     Pupils: Pupils are equal, round, and reactive to light.  Cardiovascular:     Rate and Rhythm: Normal rate and regular rhythm.  Pulmonary:     Effort: Pulmonary effort is normal.     Breath sounds: Normal breath sounds.  Abdominal:     General: Abdomen is flat. Bowel sounds are normal.     Palpations: Abdomen is soft.     Tenderness: There is no guarding or rebound.  Musculoskeletal:        General: Normal range of motion.     Cervical back: Normal range of motion.     Right lower leg: 1+ Edema present.     Left lower leg: 1+ Edema present.  Skin:    General: Skin is warm.     Capillary Refill: Capillary refill takes less than 2 seconds.  Neurological:     General: No focal deficit present.     Mental Status: She is alert and oriented to person, place, and time. Mental status is at baseline.  Psychiatric:        Mood and Affect: Mood normal.        Behavior: Behavior normal.        Thought Content: Thought content normal.        Judgment: Judgment normal.       Results RADIOLOGY Back MRI: Hepatic neoplasm  DIAGNOSTIC Colonoscopy: Polyp removed      Assessment & Plan:   Assessment and Plan Assessment & Plan Adult Wellness Visit Routine adult wellness visit with emphasis on regular screenings and vaccinations. Recent flu vaccination received. Mammogram overdue, last done three years ago. Colonoscopy up to date with polyp removal, next due in five years. Dental exams up to date. No recent Pap smear, last done three years ago. - Ordered mammogram for early next year - Encouraged scheduling Pap smear with gynecologist - Continue routine colonoscopy every five years - Ensure dental exams remain up to date -Patient Counseling(The following topics were reviewed):  Preventative care handout given to pt  Health  maintenance and immunizations reviewed. Please refer to Health maintenance section. Pt advised on safe sex, wearing seatbelts in car, and proper nutrition labwork ordered today for annual Dental health: Discussed importance of regular tooth brushing, flossing, and dental visits.   Morbid obesity due to excess calories Morbid obesity managed with Tirzepatide  (Zepbound ). Insurance coverage issues with current medication. Discussed potential for a pill form of the medication next  year, which may be more affordable. Current dose was 12.5 mg, considering reducing to 5 mg due to insurance issues and potential side effects. Discussed potential side effects of starting at a lower dose to avoid headaches. - Prescribed Tirzepatide  (Zepbound ) 5 mg as a starting dose - Discussed potential for pill form of medication next year Pt advised to work on diet and exercise as tolerated   Localized edema and varicose veins of lower extremity Chronic swelling and varicose veins in lower extremities, likely due to prolonged standing and varicose veins. No pain in calves, suggesting dependent edema rather than deep vein thrombosis. Discussed use of compression stockings and lifestyle modifications to manage symptoms. Hydrochlorothiazide prescribed as needed for edema, with caution regarding increased urination. - Prescribed hydrochlorothiazide as needed for edema - Recommended compression stockings - Advised on leg elevation and low salt diet - Will consider vascular consultation if varicose veins become painful  General Health Maintenance Discussed general health maintenance including vaccinations and screenings. Recent flu vaccination received. COVID vaccination pending. No need for pneumonia vaccine until age 32. - Ensure COVID vaccination is up to date - Continue routine health maintenance screenings           Follow-up: Return in about 1 year (around 09/25/2025) for f/u CPE.   Ginger Patrick, FNP

## 2024-09-25 NOTE — Patient Instructions (Signed)
 I have sent an electronic order over to your preferred location for the following:   []   2D Mammogram  [x]   3D Mammogram  []   Bone Density   Please give this center a call to get scheduled at your convenience.   [x]   [facility]      [address] [place], [place]        [phone]         Make sure to wear two piece  clothing  No lotions powders or deodorants the day of the appointment Make sure to bring picture ID and insurance card.  Bring list of medications you are currently taking including any supplements.

## 2024-10-22 ENCOUNTER — Ambulatory Visit
Admission: RE | Admit: 2024-10-22 | Discharge: 2024-10-22 | Disposition: A | Source: Ambulatory Visit | Attending: Family

## 2024-10-22 DIAGNOSIS — Z1231 Encounter for screening mammogram for malignant neoplasm of breast: Secondary | ICD-10-CM | POA: Diagnosis not present

## 2024-10-28 ENCOUNTER — Encounter: Payer: Self-pay | Admitting: Family

## 2024-10-28 ENCOUNTER — Ambulatory Visit: Payer: Self-pay | Admitting: Family

## 2024-10-28 DIAGNOSIS — Z205 Contact with and (suspected) exposure to viral hepatitis: Secondary | ICD-10-CM

## 2025-09-26 ENCOUNTER — Encounter: Admitting: Family
